# Patient Record
Sex: Female | Born: 1950 | Race: White | Hispanic: No | Marital: Single | Smoking: Current every day smoker
Health system: Southern US, Community
[De-identification: ages and names within clinical notes are randomized; demographics above are authoritative.]

## PROBLEM LIST (undated history)

## (undated) DIAGNOSIS — G4733 Obstructive sleep apnea (adult) (pediatric): Secondary | ICD-10-CM

## (undated) DIAGNOSIS — I5032 Chronic diastolic (congestive) heart failure: Secondary | ICD-10-CM

## (undated) DIAGNOSIS — I509 Heart failure, unspecified: Secondary | ICD-10-CM

## (undated) DIAGNOSIS — I872 Venous insufficiency (chronic) (peripheral): Secondary | ICD-10-CM

## (undated) DIAGNOSIS — G4730 Sleep apnea, unspecified: Secondary | ICD-10-CM

## (undated) DIAGNOSIS — G8929 Other chronic pain: Secondary | ICD-10-CM

## (undated) DIAGNOSIS — F419 Anxiety disorder, unspecified: Secondary | ICD-10-CM

## (undated) DIAGNOSIS — F32A Depression, unspecified: Secondary | ICD-10-CM

## (undated) DIAGNOSIS — R0902 Hypoxemia: Secondary | ICD-10-CM

## (undated) DIAGNOSIS — M1990 Unspecified osteoarthritis, unspecified site: Secondary | ICD-10-CM

## (undated) DIAGNOSIS — J449 Chronic obstructive pulmonary disease, unspecified: Secondary | ICD-10-CM

## (undated) DIAGNOSIS — Z872 Personal history of diseases of the skin and subcutaneous tissue: Secondary | ICD-10-CM

## (undated) DIAGNOSIS — K219 Gastro-esophageal reflux disease without esophagitis: Secondary | ICD-10-CM

## (undated) DIAGNOSIS — F329 Major depressive disorder, single episode, unspecified: Secondary | ICD-10-CM

## (undated) DIAGNOSIS — Z9889 Other specified postprocedural states: Secondary | ICD-10-CM

## (undated) DIAGNOSIS — E1142 Type 2 diabetes mellitus with diabetic polyneuropathy: Secondary | ICD-10-CM

## (undated) DIAGNOSIS — I10 Essential (primary) hypertension: Secondary | ICD-10-CM

## (undated) DIAGNOSIS — I890 Lymphedema, not elsewhere classified: Secondary | ICD-10-CM

## (undated) DIAGNOSIS — T7840XA Allergy, unspecified, initial encounter: Secondary | ICD-10-CM

## (undated) DIAGNOSIS — E039 Hypothyroidism, unspecified: Secondary | ICD-10-CM

## (undated) DIAGNOSIS — E669 Obesity, unspecified: Secondary | ICD-10-CM

## (undated) HISTORY — PX: OTHER SURGICAL HISTORY: SHX169

## (undated) HISTORY — DX: Heart failure, unspecified: I50.9

## (undated) HISTORY — DX: Allergy, unspecified, initial encounter: T78.40XA

## (undated) HISTORY — PX: ABDOMINAL HERNIA REPAIR: SHX539

## (undated) HISTORY — PX: EYE SURGERY: SHX253

## (undated) HISTORY — DX: Unspecified osteoarthritis, unspecified site: M19.90

## (undated) HISTORY — DX: Major depressive disorder, single episode, unspecified: F32.9

## (undated) HISTORY — DX: Sleep apnea, unspecified: G47.30

## (undated) HISTORY — PX: APPENDECTOMY: SHX54

## (undated) HISTORY — DX: Hypoxemia: R09.02

## (undated) HISTORY — PX: WRIST SURGERY: SHX841

## (undated) HISTORY — DX: Depression, unspecified: F32.A

## (undated) HISTORY — PX: TOTAL KNEE ARTHROPLASTY: SHX125

## (undated) HISTORY — PX: ABDOMINAL HYSTERECTOMY: SHX81

## (undated) HISTORY — PX: TONSILLECTOMY: SUR1361

---

## 2006-06-25 ENCOUNTER — Inpatient Hospital Stay (HOSPITAL_COMMUNITY): Admission: EM | Admit: 2006-06-25 | Discharge: 2006-06-26 | Payer: Self-pay | Admitting: Emergency Medicine

## 2007-10-20 ENCOUNTER — Ambulatory Visit: Admission: RE | Admit: 2007-10-20 | Discharge: 2007-10-20 | Payer: Self-pay | Admitting: Nurse Practitioner

## 2008-06-20 IMAGING — US US EXTREM LOW VENOUS BILAT
1 series · 14 of 24 positions shown · non-contrast
Comparison: None.

CLINICAL DATA: Bilateral lower extremity swelling.
BILATERAL LOWER EXTREMITY VENOUS DOPPLER ULTRASOUND:
TECHNIQUE: Gray-scale sonography with compression, as well as color and duplex Doppler ultrasound, were performed to evaluate the deep venous system from the level of the common femoral vein through the popliteal and proximal calf veins.

[Series 1: unknown · 14 of 47 slices shown]
[im 1/47]
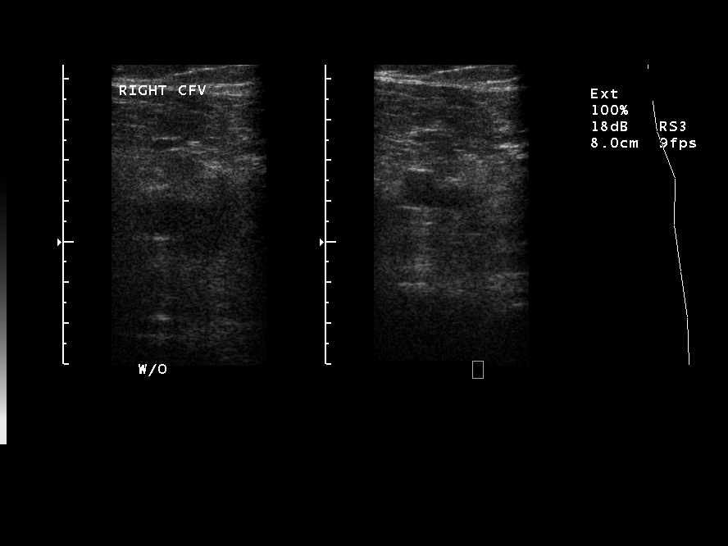
[im 5/47]
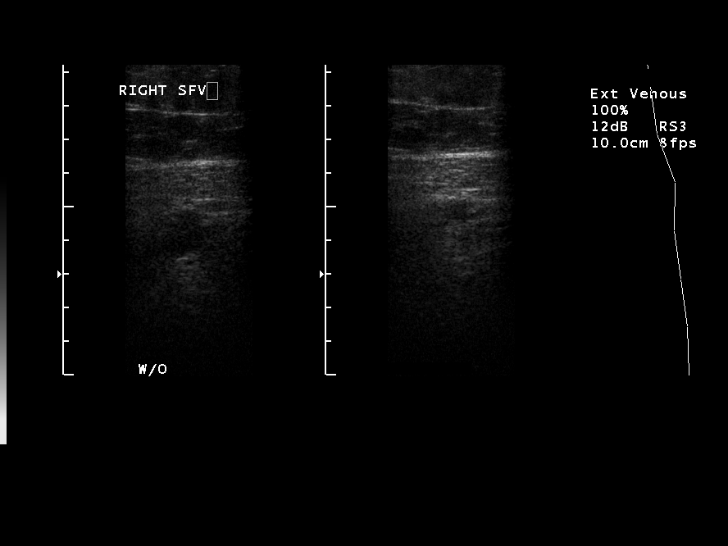
[im 9/47]
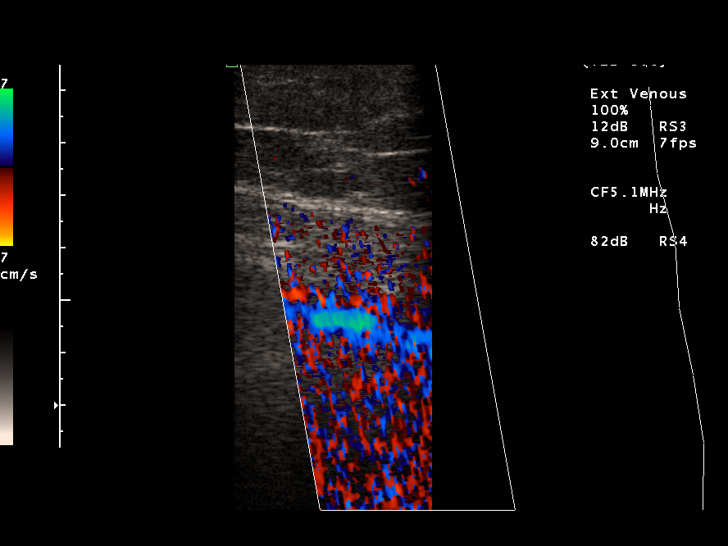
[im 13/47]
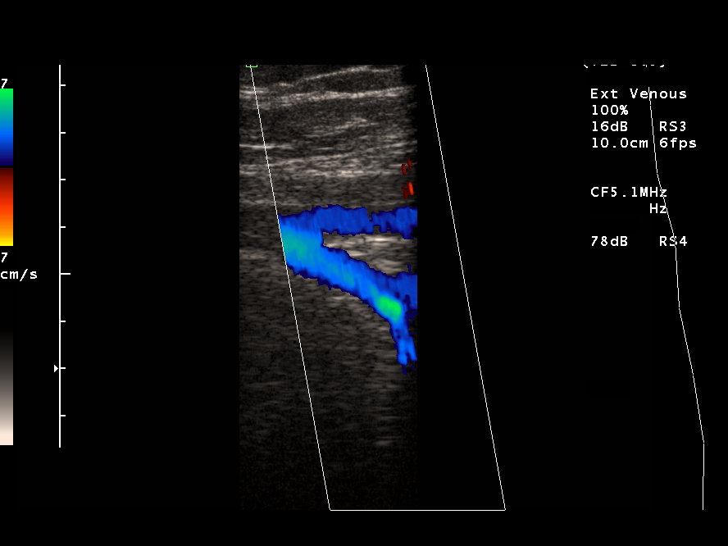
[im 15/47]
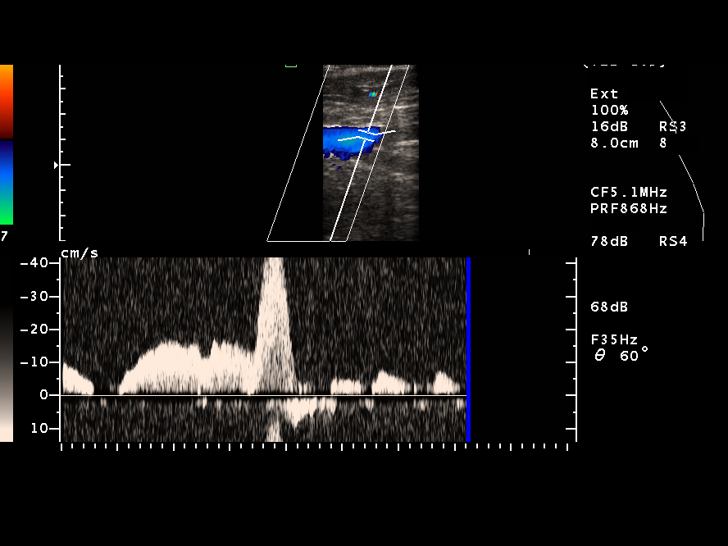
[im 19/47]
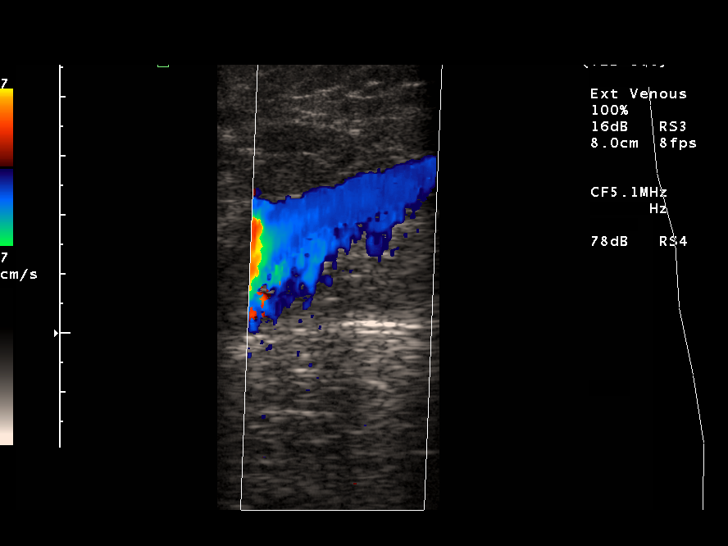
[im 23/47]
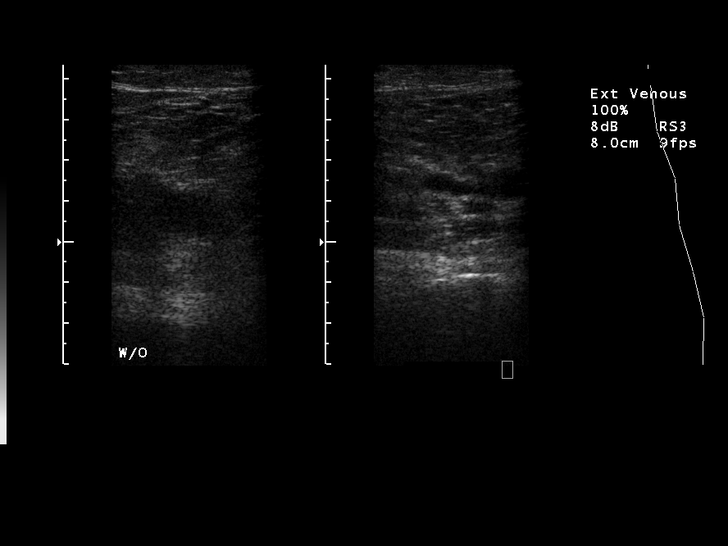
[im 25/47]
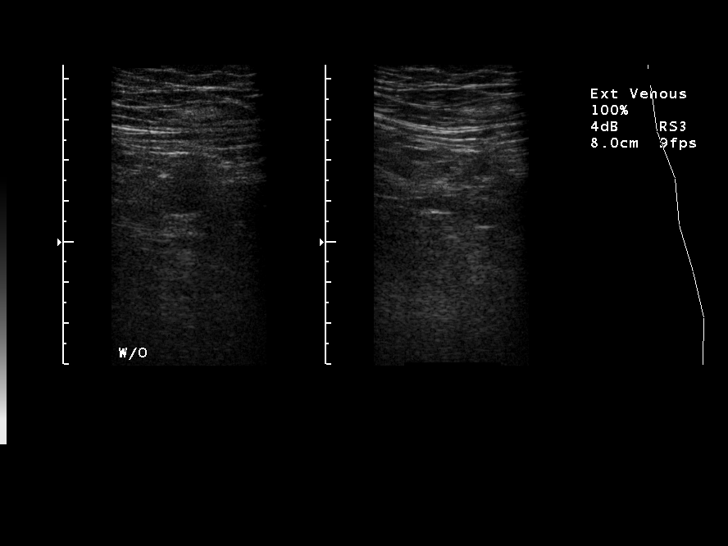
[im 29/47]
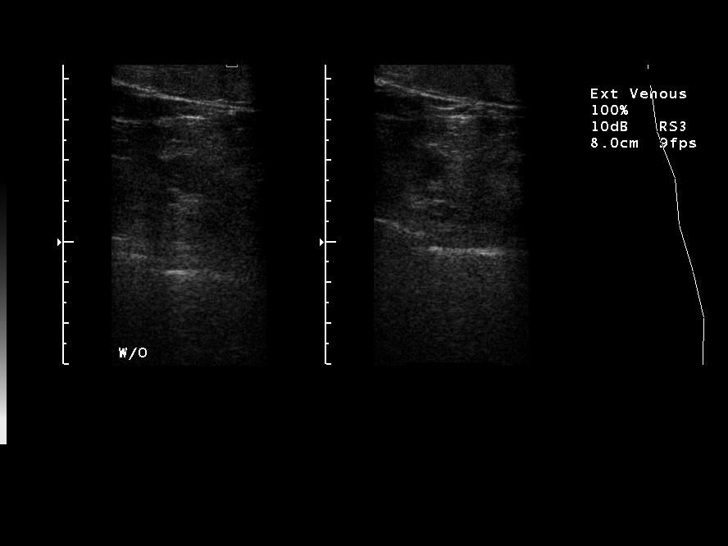
[im 33/47]
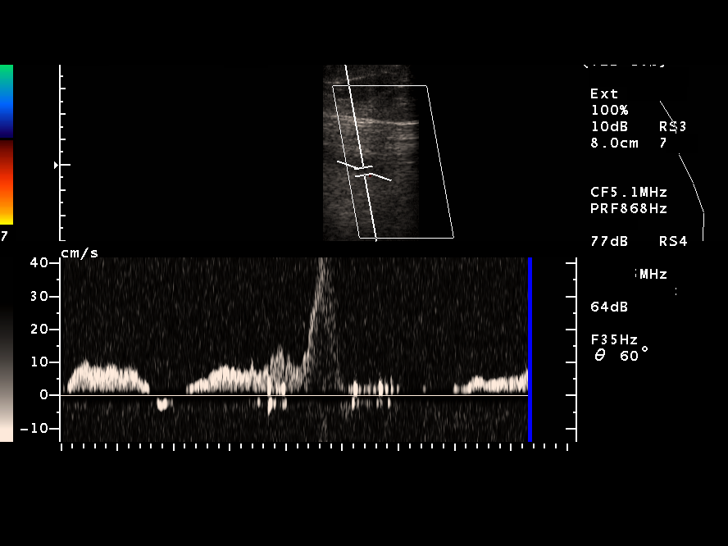
[im 37/47]
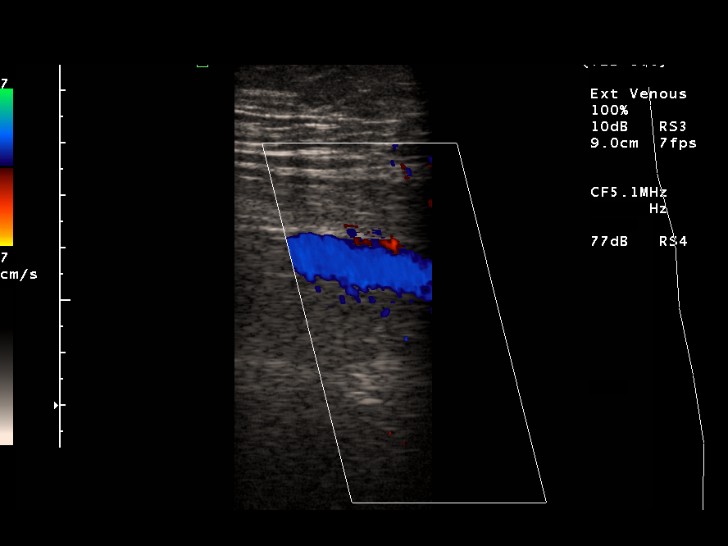
[im 39/47]
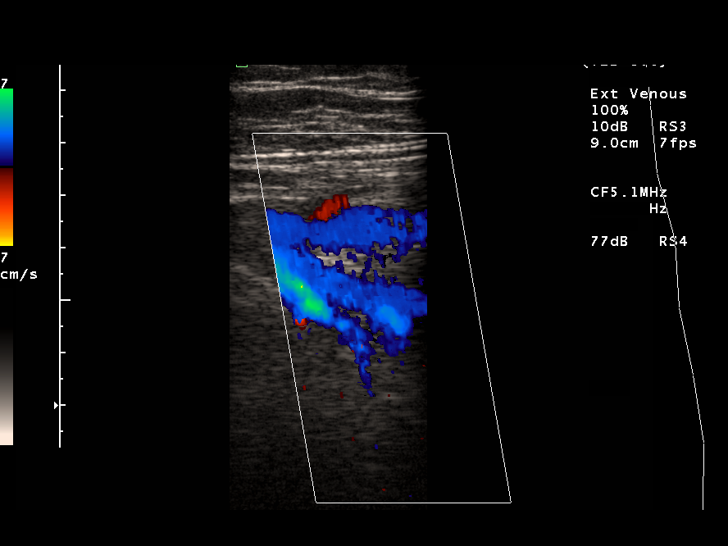
[im 43/47]
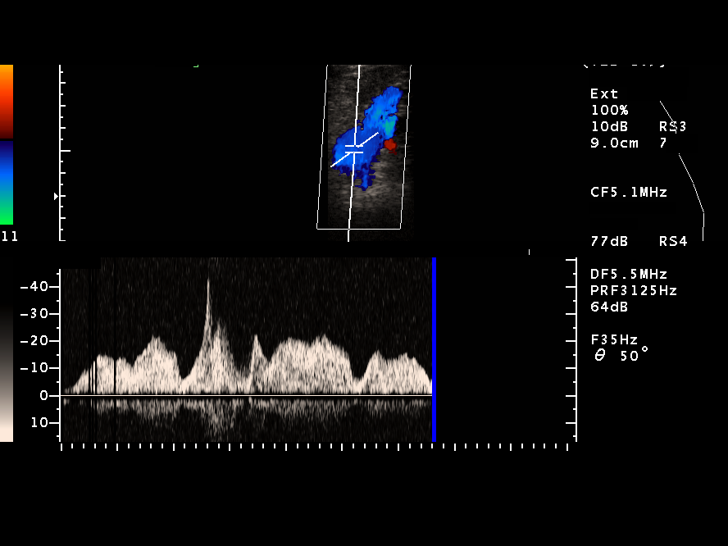
[im 47/47]
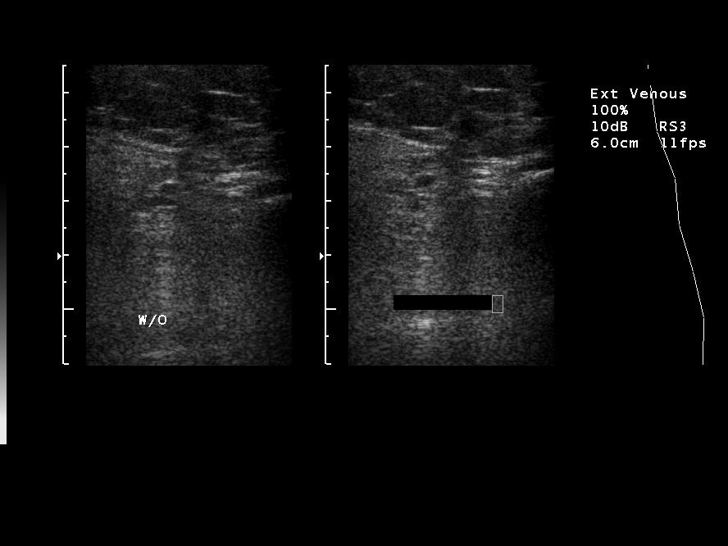

[14 of 24 positions shown; findings below may reference images not displayed]

FINDINGS: Complete compressibility is demonstrated throughout the visualized deep veins.  No venous filling defects are identified by gray-scale or color Doppler sonography.  Doppler waveforms show normal direction of venous flow, with normal phasicity and response to augmentation.
IMPRESSION: Negative.  No evidence of deep venous thrombosis.

## 2008-09-02 DIAGNOSIS — Z9889 Other specified postprocedural states: Secondary | ICD-10-CM

## 2008-09-02 HISTORY — DX: Other specified postprocedural states: Z98.890

## 2010-09-02 HISTORY — PX: SPINAL CORD STIMULATOR IMPLANT: SHX2422

## 2011-01-15 NOTE — Procedures (Signed)
NAMEBREEANNA, Jennifer Mejia           ACCOUNT NO.:  [id number]   MEDICAL RECORD NO.:  [id number]          PATIENT TYPE:  OUT   LOCATION:  SLEE                          FACILITY:  [facility]   PHYSICIAN:  Kofi A. Gerilyn Pilgrim, M.D. DATE OF BIRTH:  04/03/1951   DATE OF PROCEDURE:  10/20/2007  DATE OF DISCHARGE:  10/20/2007                             SLEEP DISORDER REPORT   INDICATION FOR STUDY:  Daytime fatigue, insomnia, and loud snoring.   EPWORTH SLEEPINESS SCALE:  8   MEDICATIONS:  Oxycodone, Ambien, Klonopin.  BMI 54.   SLEEP STUDY SUMMARY:  Total recorded time of 431 minutes.  Sleep  efficiency 86%.  REM latency 191 minutes.  Stage N1 3%, N2 37%, N3 47%,  and REM sleep 12%.   RESPIRATORY SUMMARY:  There was one obstructive event, one mixed event,  and 28 hypopneas with an overall AHI of 4.8.  The study was done on 2  liters of oxygen.  The baseline oxygen saturation was 91%.  The lowest  desaturation was during REM sleep at 81%.   ELECTROCARDIOGRAM SUMMARY:  Average heart rate 74 with no significant  dysrhythmias noted.   LIMB MOVEMENT INDEX:  The PLM index is 9.2.   IMPRESSION:  1. Hypoventilation syndrome.  2. 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   RECOMMENDATIONS:  Consider increasing nocturnal oxygen to 2-3 liters.  Thanks for this referral.      Kofi A. Gerilyn Pilgrim, M.D.  Electronically Signed     KAD/MEDQ  D:  10/26/2007  T:  10/26/2007  Job:  [id number]

## 2011-01-18 NOTE — H&P (Signed)
Jennifer Mejia, Jennifer Mejia           ACCOUNT NO.:  [id number]   MEDICAL RECORD NO.:  [id number]          PATIENT TYPE:  OBV   LOCATION:  IC06                          FACILITY:  [facility]   PHYSICIAN:  Osvaldo Shipper, MD     DATE OF BIRTH:  [birth year]   DATE OF ADMISSION:  06/23/2006  DATE OF DISCHARGE:  LH                                HISTORY & PHYSICAL   PRIMARY CARE PHYSICIAN:  Concha Pyo, NP.   ORTHOPEDIC SURGEON:  Dr. Manson Passey at [facility].   She also has a pain specialist, Dr. Brigid Re, and she also has a  Midwife.   ADMITTING DIAGNOSIS:  1. Acute chronic obstructive pulmonary disease exacerbation.  2. Chronic back pain.  3. Morbid obesity.  4. Type-2 diabetes.  5. Hypertension.   CHIEF COMPLAINT:  Shortness of breath for 2 days.   HISTORY OF PRESENT ILLNESS:  The patient is a 60 year old, morbidly obese,  Caucasian female who was doing well until about 2 days ago when she started  feeling short of breath, started feeling some wheezing.  This progressively  got worse.  Today she went to her PMDs office where she was given another  breathing treatment with no effect.  The patient has been taking breathing  treatments at home as well with no effect.  She also is complaining of some  cough with  yellowish expectoration, no blood.  No history of fevers or  chills at home.  No sick contacts, no travel outside this area recently.  She said yesterday her symptoms were quite bad.  Today after a breathing  treatment here in the ED she feels a little bit better.  She also complains  of chest pain described as a tightness for the past 2 days ever since her  breathing difficulties started.  This chest tightness is located in the  retrosternal area and is pretty much on and off.  Any activity does not make  any difference in the pain.   MEDICATIONS AT HOME:  1. Avandia 8 mg p.o. daily.  2. Nasonex 2 sprays daily.  3. Metformin 1000 mg b.i.d.  4. Atenolol 50 mg daily.  5. K-Dur 20 mEq daily.  6. Lasix 80 mg q.a.m.  7. Lipitor 40 mg daily.  8. Combivent inhaler.  9. Symbicort inhaler.  10.Protonix 40 mg daily.  11.Ambien 10 mg nightly as needed.  12.Byetta 10 units subcutaneous b.i.d.  13.Klonopin 2 mg nightly.  14.Lyrica 75 mg t.i.d.  15.Soma 350 mg t.i.d.  16.OxyContin 80 mg in the morning, 60 in the afternoon and 60 at bedtime.  17.She also takes Chantix 1 mg twice daily for smoking cessation.  18.She also is on a nicotine patch.   ALLERGIES:  SHE IS ALLERGIC TO AVELOX WHICH CAUSED SEVERE RASH ON HER BODY.   PAST MEDICAL HISTORY:  Significant for bronchitis on and off for the past 8  years.  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  History of chronic back pain for which she is going to have back  surgery  soon.  History of multiple surgeries including 9 to her left leg for chronic  venous stasis and ulcers.  Hysterectomy.  She had surgery for acid reflux,  possibly fundoplication.  History of hernia repair.  History of fracture of  the right foot with plates inserted.  History of cardiac catheterization in  2004 which was unremarkable.  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  Her records from the PMDs office show a colonoscopy which was  normal in January 2006.  Mammogram repeated in September 2007 was normal;  recheck in one year was recommended.   SOCIAL HISTORY:  Lives in [place].  Lives alone.  Smokes one pack of  cigarettes per day, but she says she quit yesterday.  No alcohol use.  She  uses a cane and walker at various points in time.  She also has a  prescription for a motorized wheelchair for her severe back pain.   FAMILY HISTORY:  Mother died; she had extensive heart disease, diabetes,  liver disease.  Father is alive and well.  Sister has a pacemaker.  No other  medical problems in the family.   REVIEW  OF SYSTEMS:  Ten-point review of systems was done which was  unremarkable except for chronic back pains and lower extremity pain from  arthritis.   PHYSICAL EXAMINATION:  VITAL SIGNS:  Temperature 97.6, blood pressure  132/59, heart rate in the 50s, respiratory rate about 24.  Saturation 92% on  room air.  GENERAL EXAM:  This is a morbidly obese white female in mild discomfort but  in no distress.  HEENT:  There is no pallor.  Mucous membranes moist.  No oral lesions are  noted.  LUNGS:  Reveal diffuse extensive bilateral end-expiratory wheezing.  No  crackles appreciated.  CARDIOVASCULAR:  S1 S2 is normal, regular, no murmurs appreciated.  ABDOMEN:  Obese, nontender, nondistended, bowel sounds are present.  No mass  or organomegaly is appreciated.  EXTREMITIES:  Mild edema and surgical scars on the left side.  There is some  erythema of the lower part of both legs, more on the left side than the  right.  There is also some tenderness to palpation in her calves.  Pulses  are present in both lower extremities.  NEUROLOGIC:  The patient is alert and oriented x3.  No focal neurological  deficits are present.   LABORATORY DATA:  Her ABG showed a pH of 7.34, pCO2 48, pO2 64, bicarb 25,  saturation 91%.  White count is 9.1 with normal differential, hemoglobin is  11.9, MCV 81, platelet count is 280.  Her BMP reveals a glucose of 187,  creatinine 0.8, calcium 8.0.   X-rays showed bronchitic changes but no acute infiltrate.  No EKG is  available.   IMPRESSION:  This is a 60 year old Caucasian female with medical problems as  stated earlier who presents with worsening shortness of breath over the past  2 days.  Symptoms and presentation consistent with acute bronchitis or  chronic obstructive pulmonary disease exacerbation.  Her other co-  morbidities appear to be stable at this time.   PLAN: 1. Acute COPD.  Will give her nebulizer treatments, give her steroids,      give her  antibiotics.  Will not give her any fluoroquinolones as she      has a documented allergy to Avelox.  She will need PFT at some point.      Oxygen will be provided  if needed.  2. Lower extremity edema . Will get venous Dopplers to rule out DVT, but      this does appear to be chronic.  3. Diabetes.  Continue her oral hypoglycemic agents.  Put her also on      sliding scale insulin and check CBG q.a.c. and h.s.  Check hemoglobin      A1c in the a.m.  4. Anemia.  Check her iron profile  5. Hypertension.  Continue atenolol.  6. Chronic back pain.  Continue all of her pain medications as prescribed      by her pain physician.  7. DVT and GI prophylaxis will be initiated.  8. Continue Chantix and nicotine patch.   Anticipate the patient going home in the next couple of days.   Further management decisions will be based on results of initial testing and  the patient's response to treatment.      Osvaldo Shipper, MD  Electronically Signed     GK/MEDQ  D:  06/23/2006  T:  06/23/2006  Job:  [id number]   cc:   Concha Pyo, NP  [address] [place], [place] [address]

## 2011-01-18 NOTE — Discharge Summary (Signed)
Jennifer Mejia, Jennifer Mejia           ACCOUNT NO.:  [id number]   MEDICAL RECORD NO.:  [id number]          PATIENT TYPE:  INP   LOCATION:  A322                          FACILITY:  [facility]   PHYSICIAN:  Osvaldo Shipper, MD     DATE OF BIRTH:  1951-07-25   DATE OF ADMISSION:  06/23/2006  DATE OF DISCHARGE:  10/25/2007LH                                 DISCHARGE SUMMARY   PRIMARY CARE PHYSICIAN:  Junius Creamer, Nurse Practitioner at [facility].   Please see H and P dictated by myself for details regarding patient's  presenting illness.   DISCHARGE DIAGNOSIS:  1. Acute chronic obstructive pulmonary disease exacerbation, improved.  2. Chronic back pain.  3. Morbid obesity.  4. Type 2 diabetes, poorly controlled.  5. Hypertension, stable.  6. Hypoxia, likely related to chronic obstructive pulmonary disease,      requiring home O2.  7. Possible sleep apnea, requiring sleep study.   BRIEF HOSPITAL COURSE:  1. COPD.  Patient is a 60 year old morbidly obese Caucasian female who      presented with worsening shortness of breath over 2 to 3 days prior to      admission.  Patient was initially evaluated by the emergency      department.  Her ABG showed a pH of 7.34, pCO2 was 48, pO2 was 64,      saturation 91%; this was on room air.  Her initial blood counts were      all pretty unremarkable except for elevated glucose.  Her TSH was      normal.  She ruled out for acute coronary syndrome.  She underwent a      chest x-ray as well which did not show any clear-cut infiltrate.      Bronchitic changes were noted.  Patient was treated for acute COPD,      given nebulizer treatment, given steroids, antibiotics.  Patient slowly      improved with this treatment strategy.  Today, patient was feeling      quite well.  She was ambulating in the hallways with no difficulties.      She was, however, found to be hypoxic with a saturation of 83% on room      air.  Hence, she was told she would need home  O2.  2. Diabetes became poorly controlled because of the steroids that were      started.  However, she was given one dose of Lantus.  She was also      started on Byetta that she takes at home.  Her blood sugars today are      much improved.   Patient also mentioned to [place] that she snores in the middle of the night.  Because of her morbid obesity, there was also a concern about sleep apnea.  We will set her up for an outpatient sleep study.  Patient would also  benefit from a pulmonary function test when her lungs are back to her  baseline.  She may benefit from Advair, which we will defer to her PMD after  the PFTs are done.   Her other medical  issues including obesity, chronic back pains,  hypertension, all remained stable during this hospitalization.   She was also found to have during this hospital stay, possible iron  deficiency anemia.  Her TIBC was very high, iron was low, percent sat was  low, ferritin was low.  She was started on Nu-Iron.  According to the report  from her PMD's office, she has had a colonoscopy within the last couple of  years which was unremarkable.  Hence, I think patient may benefit from being  referred to a hematologist.  I will defer this issue again to the PMD for  now.   Please also note that the patient has a borderline low B12 level for which  she was given one shot 1000 mg vitamin B12.   On the day of discharge, patient was feeling really well.  She wanted to go  home, and we do not see any reason why not.   DISCHARGE MEDICATIONS:  1. Albuterol 2.5 mg nebulizers every 6 hours.  2. Atrovent 0.5 mg nebulizer every 6 hours.  3. Prednisone 40 mg for 4 days, 20 mg for 4 days, and then 10 mg for 4      days.  4. Vantin 100 mg b.i.d. for 4 days.  5. Nu-Iron 150 mg p.o. daily.   Oxygen by Nasal Canula at 2lpm.   Otherwise, she was asked to continue her other outpatient medications as  before.  Please see H and P for a complete list.   DIET:  She  was asked to follow a low modified carbohydrate diet.   PHYSICAL ACTIVITY:  She uses a cane to ambulate, which she was recommended  to do so.  She was also seen by PT and was thought not to need any skill PT  at this time.   FOLLOWUP:  1. Follow up with her PMD in 1 to 2 weeks.  2. For a pulmonary function test in 6 to 8 weeks.  3. Outpatient sleep study in 4 to 6 weeks.   No consultations were obtained.   Total time discharging this patient, about 40 minutes.      Osvaldo Shipper, MD  Electronically Signed     GK/MEDQ  D:  06/26/2006  T:  06/26/2006  Job:  [id number]   cc:   Ninfa Linden, NP

## 2014-11-09 ENCOUNTER — Other Ambulatory Visit (HOSPITAL_COMMUNITY): Payer: Self-pay

## 2014-11-09 ENCOUNTER — Encounter (HOSPITAL_COMMUNITY): Payer: Self-pay

## 2014-11-09 ENCOUNTER — Inpatient Hospital Stay (HOSPITAL_COMMUNITY)
Admission: EM | Admit: 2014-11-09 | Discharge: 2014-11-14 | DRG: 291 | Disposition: A | Discharge status: Home Health | Payer: Medicare Other | Attending: Internal Medicine | Admitting: Internal Medicine

## 2014-11-09 ENCOUNTER — Emergency Department (HOSPITAL_COMMUNITY): Payer: Medicare Other

## 2014-11-09 DIAGNOSIS — L03115 Cellulitis of right lower limb: Secondary | ICD-10-CM | POA: Diagnosis not present

## 2014-11-09 DIAGNOSIS — I5033 Acute on chronic diastolic (congestive) heart failure: Secondary | ICD-10-CM | POA: Insufficient documentation

## 2014-11-09 DIAGNOSIS — I248 Other forms of acute ischemic heart disease: Secondary | ICD-10-CM | POA: Diagnosis present

## 2014-11-09 DIAGNOSIS — Z96653 Presence of artificial knee joint, bilateral: Secondary | ICD-10-CM | POA: Diagnosis present

## 2014-11-09 DIAGNOSIS — K219 Gastro-esophageal reflux disease without esophagitis: Secondary | ICD-10-CM | POA: Diagnosis present

## 2014-11-09 DIAGNOSIS — L03119 Cellulitis of unspecified part of limb: Secondary | ICD-10-CM | POA: Diagnosis not present

## 2014-11-09 DIAGNOSIS — I890 Lymphedema, not elsewhere classified: Secondary | ICD-10-CM | POA: Diagnosis not present

## 2014-11-09 DIAGNOSIS — Z9119 Patient's noncompliance with other medical treatment and regimen: Secondary | ICD-10-CM | POA: Diagnosis present

## 2014-11-09 DIAGNOSIS — Z7951 Long term (current) use of inhaled steroids: Secondary | ICD-10-CM | POA: Diagnosis not present

## 2014-11-09 DIAGNOSIS — L0390 Cellulitis, unspecified: Secondary | ICD-10-CM

## 2014-11-09 DIAGNOSIS — Z79891 Long term (current) use of opiate analgesic: Secondary | ICD-10-CM | POA: Diagnosis not present

## 2014-11-09 DIAGNOSIS — E1165 Type 2 diabetes mellitus with hyperglycemia: Secondary | ICD-10-CM | POA: Diagnosis present

## 2014-11-09 DIAGNOSIS — I10 Essential (primary) hypertension: Secondary | ICD-10-CM | POA: Diagnosis present

## 2014-11-09 DIAGNOSIS — Z833 Family history of diabetes mellitus: Secondary | ICD-10-CM

## 2014-11-09 DIAGNOSIS — Z8249 Family history of ischemic heart disease and other diseases of the circulatory system: Secondary | ICD-10-CM | POA: Diagnosis not present

## 2014-11-09 DIAGNOSIS — I509 Heart failure, unspecified: Secondary | ICD-10-CM | POA: Diagnosis not present

## 2014-11-09 DIAGNOSIS — E1142 Type 2 diabetes mellitus with diabetic polyneuropathy: Secondary | ICD-10-CM | POA: Diagnosis present

## 2014-11-09 DIAGNOSIS — E039 Hypothyroidism, unspecified: Secondary | ICD-10-CM | POA: Diagnosis present

## 2014-11-09 DIAGNOSIS — Z6841 Body Mass Index (BMI) 40.0 and over, adult: Secondary | ICD-10-CM | POA: Diagnosis not present

## 2014-11-09 DIAGNOSIS — G8929 Other chronic pain: Secondary | ICD-10-CM | POA: Diagnosis present

## 2014-11-09 DIAGNOSIS — Z9981 Dependence on supplemental oxygen: Secondary | ICD-10-CM

## 2014-11-09 DIAGNOSIS — R0602 Shortness of breath: Secondary | ICD-10-CM

## 2014-11-09 DIAGNOSIS — F17210 Nicotine dependence, cigarettes, uncomplicated: Secondary | ICD-10-CM | POA: Diagnosis present

## 2014-11-09 DIAGNOSIS — M549 Dorsalgia, unspecified: Secondary | ICD-10-CM | POA: Diagnosis present

## 2014-11-09 DIAGNOSIS — J9621 Acute and chronic respiratory failure with hypoxia: Secondary | ICD-10-CM | POA: Diagnosis present

## 2014-11-09 DIAGNOSIS — J441 Chronic obstructive pulmonary disease with (acute) exacerbation: Secondary | ICD-10-CM

## 2014-11-09 DIAGNOSIS — E119 Type 2 diabetes mellitus without complications: Secondary | ICD-10-CM | POA: Diagnosis not present

## 2014-11-09 DIAGNOSIS — Z79899 Other long term (current) drug therapy: Secondary | ICD-10-CM

## 2014-11-09 DIAGNOSIS — E662 Morbid (severe) obesity with alveolar hypoventilation: Secondary | ICD-10-CM | POA: Diagnosis present

## 2014-11-09 DIAGNOSIS — Z794 Long term (current) use of insulin: Secondary | ICD-10-CM

## 2014-11-09 HISTORY — DX: Anxiety disorder, unspecified: F41.9

## 2014-11-09 HISTORY — DX: Gastro-esophageal reflux disease without esophagitis: K21.9

## 2014-11-09 HISTORY — DX: Lymphedema, not elsewhere classified: I89.0

## 2014-11-09 HISTORY — DX: Chronic obstructive pulmonary disease, unspecified: J44.9

## 2014-11-09 HISTORY — DX: Essential (primary) hypertension: I10

## 2014-11-09 HISTORY — DX: Obesity, unspecified: E66.9

## 2014-11-09 HISTORY — DX: Other specified postprocedural states: Z98.890

## 2014-11-09 HISTORY — DX: Type 2 diabetes mellitus with diabetic polyneuropathy: E11.42

## 2014-11-09 HISTORY — DX: Obstructive sleep apnea (adult) (pediatric): G47.33

## 2014-11-09 HISTORY — DX: Personal history of diseases of the skin and subcutaneous tissue: Z87.2

## 2014-11-09 HISTORY — DX: Hypothyroidism, unspecified: E03.9

## 2014-11-09 HISTORY — DX: Venous insufficiency (chronic) (peripheral): I87.2

## 2014-11-09 HISTORY — DX: Other chronic pain: G89.29

## 2014-11-09 HISTORY — DX: Chronic diastolic (congestive) heart failure: I50.32

## 2014-11-09 LAB — BASIC METABOLIC PANEL
Anion gap: 7 (ref 5–15)
BUN: 16 mg/dL (ref 6–23)
CO2: 35 mmol/L — AB (ref 19–32)
Calcium: 8.2 mg/dL — ABNORMAL LOW (ref 8.4–10.5)
Chloride: 93 mmol/L — ABNORMAL LOW (ref 96–112)
Creatinine, Ser: 0.95 mg/dL (ref 0.50–1.10)
GFR, EST AFRICAN AMERICAN: 73 mL/min — AB (ref >90)
GFR, EST NON AFRICAN AMERICAN: 63 mL/min — AB (ref >90)
Glucose, Bld: 221 mg/dL — ABNORMAL HIGH (ref 70–99)
POTASSIUM: 3.4 mmol/L — AB (ref 3.5–5.1)
Sodium: 135 mmol/L (ref 135–145)

## 2014-11-09 LAB — CBC WITH DIFFERENTIAL/PLATELET
BASOS ABS: 0 10*3/uL (ref 0.0–0.1)
Basophils Relative: 0 % (ref 0–1)
EOS ABS: 0.1 10*3/uL (ref 0.0–0.7)
Eosinophils Relative: 1 % (ref 0–5)
HEMATOCRIT: 42.1 % (ref 36.0–46.0)
HEMOGLOBIN: 13 g/dL (ref 12.0–15.0)
LYMPHS ABS: 0.8 10*3/uL (ref 0.7–4.0)
LYMPHS PCT: 8 % — AB (ref 12–46)
MCH: 26.4 pg (ref 26.0–34.0)
MCHC: 30.9 g/dL (ref 30.0–36.0)
MCV: 85.4 fL (ref 78.0–100.0)
MONO ABS: 0.7 10*3/uL (ref 0.1–1.0)
Monocytes Relative: 8 % (ref 3–12)
NEUTROS ABS: 8.1 10*3/uL — AB (ref 1.7–7.7)
Neutrophils Relative %: 83 % — ABNORMAL HIGH (ref 43–77)
PLATELETS: 237 10*3/uL (ref 150–400)
RBC: 4.93 MIL/uL (ref 3.87–5.11)
RDW: 17 % — ABNORMAL HIGH (ref 11.5–15.5)
WBC: 9.8 10*3/uL (ref 4.0–10.5)

## 2014-11-09 LAB — URINALYSIS, ROUTINE W REFLEX MICROSCOPIC
BILIRUBIN URINE: NEGATIVE
GLUCOSE, UA: NEGATIVE mg/dL
HGB URINE DIPSTICK: NEGATIVE
KETONES UR: NEGATIVE mg/dL
LEUKOCYTES UA: NEGATIVE
NITRITE: NEGATIVE
PH: 6 (ref 5.0–8.0)
PROTEIN: NEGATIVE mg/dL
UROBILINOGEN UA: 0.2 mg/dL (ref 0.0–1.0)

## 2014-11-09 LAB — BRAIN NATRIURETIC PEPTIDE: B NATRIURETIC PEPTIDE 5: 105 pg/mL — AB (ref 0.0–100.0)

## 2014-11-09 LAB — TROPONIN I: TROPONIN I: 0.27 ng/mL — AB (ref <0.031)

## 2014-11-09 MED ORDER — ATORVASTATIN CALCIUM 40 MG PO TABS
80.0000 mg | ORAL_TABLET | Freq: Every day | ORAL | Status: DC
  Administered 2014-11-10 – 2014-11-13 (×5): 80 mg via ORAL
  Filled 2014-11-09 (×5): qty 2

## 2014-11-09 MED ORDER — METHYLPREDNISOLONE SODIUM SUCC 125 MG IJ SOLR
60.0000 mg | Freq: Four times a day (QID) | INTRAMUSCULAR | Status: DC
  Administered 2014-11-10 (×2): 60 mg via INTRAVENOUS
  Filled 2014-11-09 (×2): qty 2

## 2014-11-09 MED ORDER — GABAPENTIN 400 MG PO CAPS
800.0000 mg | ORAL_CAPSULE | ORAL | Status: DC
  Administered 2014-11-10 – 2014-11-14 (×18): 800 mg via ORAL
  Filled 2014-11-09 (×9): qty 2
  Filled 2014-11-09: qty 8
  Filled 2014-11-09 (×12): qty 2

## 2014-11-09 MED ORDER — SPIRONOLACTONE 25 MG PO TABS
50.0000 mg | ORAL_TABLET | Freq: Every day | ORAL | Status: DC
  Administered 2014-11-10 – 2014-11-14 (×5): 50 mg via ORAL
  Filled 2014-11-09 (×5): qty 2

## 2014-11-09 MED ORDER — SODIUM CHLORIDE 0.9 % IV SOLN
250.0000 mL | INTRAVENOUS | Status: DC | PRN
  Administered 2014-11-10: 10 mL via INTRAVENOUS

## 2014-11-09 MED ORDER — ONDANSETRON HCL 4 MG PO TABS
4.0000 mg | ORAL_TABLET | Freq: Four times a day (QID) | ORAL | Status: DC | PRN
  Administered 2014-11-11: 4 mg via ORAL
  Filled 2014-11-09: qty 1

## 2014-11-09 MED ORDER — PRIMIDONE 50 MG PO TABS
50.0000 mg | ORAL_TABLET | Freq: Two times a day (BID) | ORAL | Status: DC
  Administered 2014-11-10 – 2014-11-14 (×10): 50 mg via ORAL
  Filled 2014-11-09 (×14): qty 1

## 2014-11-09 MED ORDER — ONDANSETRON HCL 4 MG/2ML IJ SOLN: 4.0000 mg | Freq: Four times a day (QID) | INTRAMUSCULAR | Status: DC | PRN

## 2014-11-09 MED ORDER — PREDNISOLONE ACETATE 1 % OP SUSP
1.0000 [drp] | Freq: Four times a day (QID) | OPHTHALMIC | Status: DC
  Administered 2014-11-10: 1 [drp] via OPHTHALMIC
  Filled 2014-11-09: qty 1

## 2014-11-09 MED ORDER — OXYCODONE HCL ER 20 MG PO T12A
60.0000 mg | EXTENDED_RELEASE_TABLET | Freq: Three times a day (TID) | ORAL | Status: DC
  Administered 2014-11-10 – 2014-11-14 (×13): 60 mg via ORAL
  Filled 2014-11-09: qty 3
  Filled 2014-11-09: qty 6
  Filled 2014-11-09 (×2): qty 3
  Filled 2014-11-09 (×2): qty 6
  Filled 2014-11-09 (×7): qty 3

## 2014-11-09 MED ORDER — PANTOPRAZOLE SODIUM 40 MG PO TBEC
40.0000 mg | DELAYED_RELEASE_TABLET | Freq: Two times a day (BID) | ORAL | Status: DC
  Administered 2014-11-10 – 2014-11-14 (×10): 40 mg via ORAL
  Filled 2014-11-09 (×10): qty 1

## 2014-11-09 MED ORDER — OXYCODONE HCL 5 MG PO TABS: 15.0000 mg | ORAL_TABLET | Freq: Four times a day (QID) | ORAL | Status: DC

## 2014-11-09 MED ORDER — IPRATROPIUM-ALBUTEROL 0.5-2.5 (3) MG/3ML IN SOLN
3.0000 mL | Freq: Four times a day (QID) | RESPIRATORY_TRACT | Status: DC
  Administered 2014-11-10 – 2014-11-14 (×16): 3 mL via RESPIRATORY_TRACT
  Filled 2014-11-09 (×16): qty 3

## 2014-11-09 MED ORDER — ENOXAPARIN SODIUM 40 MG/0.4ML ~~LOC~~ SOLN
40.0000 mg | SUBCUTANEOUS | Status: DC
  Administered 2014-11-10: 40 mg via SUBCUTANEOUS
  Filled 2014-11-09: qty 0.4

## 2014-11-09 MED ORDER — RAMIPRIL 5 MG PO CAPS
10.0000 mg | ORAL_CAPSULE | Freq: Every day | ORAL | Status: DC
  Administered 2014-11-10 – 2014-11-14 (×5): 10 mg via ORAL
  Filled 2014-11-09 (×5): qty 2

## 2014-11-09 MED ORDER — BUDESONIDE-FORMOTEROL FUMARATE 160-4.5 MCG/ACT IN AERO
2.0000 | INHALATION_SPRAY | Freq: Two times a day (BID) | RESPIRATORY_TRACT | Status: DC
  Administered 2014-11-10 – 2014-11-14 (×8): 2 via RESPIRATORY_TRACT
  Filled 2014-11-09: qty 6

## 2014-11-09 MED ORDER — IPRATROPIUM-ALBUTEROL 0.5-2.5 (3) MG/3ML IN SOLN
3.0000 mL | RESPIRATORY_TRACT | Status: DC
  Administered 2014-11-09 (×2): 3 mL via RESPIRATORY_TRACT
  Filled 2014-11-09 (×2): qty 3

## 2014-11-09 MED ORDER — LORATADINE 10 MG PO TABS
10.0000 mg | ORAL_TABLET | Freq: Every day | ORAL | Status: DC
  Administered 2014-11-10 – 2014-11-13 (×4): 10 mg via ORAL
  Filled 2014-11-09 (×4): qty 1

## 2014-11-09 MED ORDER — LEVOTHYROXINE SODIUM 50 MCG PO TABS
50.0000 ug | ORAL_TABLET | Freq: Every day | ORAL | Status: DC
  Administered 2014-11-10 – 2014-11-14 (×5): 50 ug via ORAL
  Filled 2014-11-09 (×2): qty 2
  Filled 2014-11-09: qty 1
  Filled 2014-11-09: qty 2
  Filled 2014-11-09: qty 1

## 2014-11-09 MED ORDER — ALBUTEROL SULFATE (2.5 MG/3ML) 0.083% IN NEBU: 2.5000 mg | INHALATION_SOLUTION | Freq: Four times a day (QID) | RESPIRATORY_TRACT | Status: DC

## 2014-11-09 MED ORDER — LEVOCETIRIZINE DIHYDROCHLORIDE 5 MG PO TABS: 5.0000 mg | ORAL_TABLET | Freq: Every evening | ORAL | Status: DC

## 2014-11-09 MED ORDER — CLONAZEPAM 0.5 MG PO TABS
2.0000 mg | ORAL_TABLET | Freq: Every day | ORAL | Status: DC
  Administered 2014-11-10 – 2014-11-13 (×5): 2 mg via ORAL
  Filled 2014-11-09 (×5): qty 4

## 2014-11-09 MED ORDER — GABAPENTIN 400 MG PO CAPS
1600.0000 mg | ORAL_CAPSULE | Freq: Every day | ORAL | Status: DC
  Administered 2014-11-10 – 2014-11-13 (×5): 1600 mg via ORAL
  Filled 2014-11-09 (×7): qty 4

## 2014-11-09 MED ORDER — LEVOTHYROXINE SODIUM 25 MCG PO TABS: 25.0000 ug | ORAL_TABLET | Freq: Every day | ORAL | Status: DC

## 2014-11-09 MED ORDER — DOCUSATE SODIUM 100 MG PO CAPS
100.0000 mg | ORAL_CAPSULE | Freq: Two times a day (BID) | ORAL | Status: DC
  Administered 2014-11-10 – 2014-11-14 (×10): 100 mg via ORAL
  Filled 2014-11-09 (×10): qty 1

## 2014-11-09 MED ORDER — OXYCODONE HCL 5 MG PO TABS
15.0000 mg | ORAL_TABLET | Freq: Four times a day (QID) | ORAL | Status: DC | PRN
  Administered 2014-11-10 – 2014-11-13 (×2): 15 mg via ORAL
  Filled 2014-11-09 (×2): qty 3

## 2014-11-09 MED ORDER — BUDESONIDE-FORMOTEROL FUMARATE 160-4.5 MCG/ACT IN AERO
INHALATION_SPRAY | RESPIRATORY_TRACT | Status: AC
  Filled 2014-11-09: qty 6

## 2014-11-09 MED ORDER — ZOLPIDEM TARTRATE 5 MG PO TABS
5.0000 mg | ORAL_TABLET | Freq: Every evening | ORAL | Status: DC | PRN
  Administered 2014-11-10 – 2014-11-13 (×4): 5 mg via ORAL
  Filled 2014-11-09 (×5): qty 1

## 2014-11-09 MED ORDER — IPRATROPIUM BROMIDE 0.02 % IN SOLN: 0.5000 mg | Freq: Four times a day (QID) | RESPIRATORY_TRACT | Status: DC

## 2014-11-09 MED ORDER — DILTIAZEM HCL ER COATED BEADS 120 MG PO CP24
120.0000 mg | ORAL_CAPSULE | Freq: Every day | ORAL | Status: DC
  Administered 2014-11-10 – 2014-11-14 (×5): 120 mg via ORAL
  Filled 2014-11-09 (×5): qty 1

## 2014-11-09 MED ORDER — DULOXETINE HCL 60 MG PO CPEP
60.0000 mg | ORAL_CAPSULE | Freq: Every day | ORAL | Status: DC
  Administered 2014-11-10 – 2014-11-14 (×5): 60 mg via ORAL
  Filled 2014-11-09 (×5): qty 1

## 2014-11-09 MED ORDER — FUROSEMIDE 10 MG/ML IJ SOLN
40.0000 mg | Freq: Once | INTRAMUSCULAR | Status: AC
  Administered 2014-11-09: 40 mg via INTRAVENOUS
  Filled 2014-11-09: qty 4

## 2014-11-09 MED ORDER — FUROSEMIDE 10 MG/ML IJ SOLN
40.0000 mg | Freq: Two times a day (BID) | INTRAMUSCULAR | Status: DC
  Administered 2014-11-10: 40 mg via INTRAVENOUS
  Filled 2014-11-09: qty 4

## 2014-11-09 MED ORDER — ACETAMINOPHEN 325 MG PO TABS: 650.0000 mg | ORAL_TABLET | Freq: Four times a day (QID) | ORAL | Status: DC | PRN

## 2014-11-09 MED ORDER — POTASSIUM CHLORIDE CRYS ER 20 MEQ PO TBCR
40.0000 meq | EXTENDED_RELEASE_TABLET | ORAL | Status: AC
  Administered 2014-11-10 (×2): 40 meq via ORAL
  Filled 2014-11-09 (×2): qty 2

## 2014-11-09 MED ORDER — INSULIN GLARGINE 100 UNIT/ML ~~LOC~~ SOLN
60.0000 [IU] | Freq: Two times a day (BID) | SUBCUTANEOUS | Status: DC
  Administered 2014-11-10 (×2): 60 [IU] via SUBCUTANEOUS
  Filled 2014-11-09 (×4): qty 0.6

## 2014-11-09 MED ORDER — ARIPIPRAZOLE 10 MG PO TABS
10.0000 mg | ORAL_TABLET | Freq: Every day | ORAL | Status: DC
  Administered 2014-11-10 – 2014-11-14 (×5): 10 mg via ORAL
  Filled 2014-11-09 (×5): qty 1

## 2014-11-09 MED ORDER — FLUTICASONE PROPIONATE 50 MCG/ACT NA SUSP
2.0000 | Freq: Every day | NASAL | Status: DC
  Administered 2014-11-10 – 2014-11-14 (×5): 2 via NASAL
  Filled 2014-11-09: qty 16

## 2014-11-09 MED ORDER — METHYLPREDNISOLONE SODIUM SUCC 125 MG IJ SOLR
125.0000 mg | Freq: Once | INTRAMUSCULAR | Status: AC
  Administered 2014-11-09: 125 mg via INTRAVENOUS
  Filled 2014-11-09: qty 2

## 2014-11-09 MED ORDER — SENNOSIDES-DOCUSATE SODIUM 8.6-50 MG PO TABS
2.0000 | ORAL_TABLET | Freq: Two times a day (BID) | ORAL | Status: DC
  Administered 2014-11-10 – 2014-11-14 (×10): 2 via ORAL
  Filled 2014-11-09 (×10): qty 2

## 2014-11-09 MED ORDER — MONTELUKAST SODIUM 10 MG PO TABS
10.0000 mg | ORAL_TABLET | Freq: Every day | ORAL | Status: DC
  Administered 2014-11-10 – 2014-11-13 (×5): 10 mg via ORAL
  Filled 2014-11-09 (×5): qty 1

## 2014-11-09 MED ORDER — SODIUM CHLORIDE 0.9 % IJ SOLN
3.0000 mL | INTRAMUSCULAR | Status: DC | PRN
  Administered 2014-11-13 (×3): 3 mL via INTRAVENOUS
  Filled 2014-11-09 (×3): qty 3

## 2014-11-09 MED ORDER — SODIUM CHLORIDE 0.9 % IJ SOLN
3.0000 mL | Freq: Two times a day (BID) | INTRAMUSCULAR | Status: DC
  Administered 2014-11-10 – 2014-11-13 (×8): 3 mL via INTRAVENOUS

## 2014-11-09 MED ORDER — VANCOMYCIN HCL 10 G IV SOLR
2500.0000 mg | Freq: Once | INTRAVENOUS | Status: AC
  Administered 2014-11-10: 2500 mg via INTRAVENOUS
  Filled 2014-11-09: qty 2500

## 2014-11-09 MED ORDER — INSULIN ASPART 100 UNIT/ML ~~LOC~~ SOLN
0.0000 [IU] | Freq: Three times a day (TID) | SUBCUTANEOUS | Status: DC
  Administered 2014-11-10: 7 [IU] via SUBCUTANEOUS

## 2014-11-09 MED ORDER — ACETAMINOPHEN 650 MG RE SUPP: 650.0000 mg | Freq: Four times a day (QID) | RECTAL | Status: DC | PRN

## 2014-11-09 MED ORDER — CARISOPRODOL 350 MG PO TABS
350.0000 mg | ORAL_TABLET | Freq: Four times a day (QID) | ORAL | Status: DC | PRN
  Administered 2014-11-10 – 2014-11-11 (×3): 350 mg via ORAL
  Filled 2014-11-09 (×3): qty 1

## 2014-11-09 MED ORDER — PREDNISOLONE ACETATE 1 % OP SUSP
OPHTHALMIC | Status: AC
  Filled 2014-11-09: qty 5

## 2014-11-09 MED ORDER — PRIMIDONE 50 MG PO TABS
ORAL_TABLET | ORAL | Status: AC
  Filled 2014-11-09: qty 1

## 2014-11-09 NOTE — ED Notes (Signed)
Patient will be going to ICU Room 02, spoke with staff, they are having to call a nurse in and will call for report when s/he arrives. Steward DroneBrenda, ED charge nurse aware.

## 2014-11-09 NOTE — ED Notes (Signed)
Pt c/o of SOB times 3 days, with bilateral leg swelling, neb treatments used at home with no success. 98-99% RA, 1 neb treatment in route.  Patient c/o of chest pain from coughing, 324 asa and 1 sublingual nitro given in route.

## 2014-11-09 NOTE — ED Notes (Signed)
Report given to PattersonBob, in SDU. All questions answered/

## 2014-11-09 NOTE — H&P (Signed)
PCP:   CRIM, [place] DAVID, MD   Chief Complaint:  Shortness of breath  HPI: 64 year old female who   has a past medical history of COPD (chronic obstructive pulmonary disease); Diabetes mellitus without complication; Hypertension; Anxiety; Thyroid disease; Hypoactive thyroid; and Chronic congestive heart failure. Today came to the ED with chief complaint of shortness of breath since this morning, she also has been coughing up clear phlegm. She denies any fever, no nausea vomiting or diarrhea. Patient also complains of pain and redness in the lower extremities. In the ED patient was found to have mild elevation of troponin 0.27, Dr. Fayrene Fearing to ED physician called Dr. Patty Sermons at [facility]. Who recommended to keep the patient at [facility] hospital. Cardiologist not concerned with non-STEMI. Likely from demand ischemia from CHF exacerbation. Patient denies any chest pain, has been complaining of fluttering of muscle in the right chest wall for past few days.  Allergies:   Allergies  Allergen Reactions  . Avelox [Moxifloxacin Hcl In Nacl] Anaphylaxis  . Wellbutrin [Bupropion] Other (See Comments)    Jittery and anxiety      Past Medical History  Diagnosis Date  . COPD (chronic obstructive pulmonary disease)   . Diabetes mellitus without complication   . Hypertension   . Anxiety   . Thyroid disease   . Hypoactive thyroid   . Chronic congestive heart failure     History reviewed. No pertinent past surgical history.  Prior to Admission medications   Medication Sig Start Date End Date Taking? Authorizing Provider  albuterol (PROVENTIL HFA;VENTOLIN HFA) 108 (90 BASE) MCG/ACT inhaler Inhale 1-2 puffs into the lungs every 6 (six) hours as needed for wheezing or shortness of breath.   Yes Historical Provider, MD  ARIPiprazole (ABILIFY) 10 MG tablet Take 10 mg by mouth daily.   Yes Historical Provider, MD  atorvastatin (LIPITOR) 80 MG tablet Take 80 mg by mouth at bedtime.   Yes  Historical Provider, MD  budesonide-formoterol (SYMBICORT) 160-4.5 MCG/ACT inhaler Inhale 2 puffs into the lungs 2 (two) times daily.   Yes Historical Provider, MD  carisoprodol (SOMA) 350 MG tablet Take 350 mg by mouth 4 (four) times daily as needed for muscle spasms.   Yes Historical Provider, MD  Cholecalciferol 1000 UNITS tablet Take 1,000 Units by mouth daily.   Yes Historical Provider, MD  clonazePAM (KLONOPIN) 2 MG tablet Take 2 mg by mouth at bedtime.   Yes Historical Provider, MD  diltiazem (CARDIZEM CD) 120 MG 24 hr capsule Take 120 mg by mouth daily.   Yes Historical Provider, MD  diltiazem (CARDIZEM CD) 120 MG 24 hr capsule Take 120 mg by mouth daily. 06/15/14 02/10/15 Yes Historical Provider, MD  docusate sodium (COLACE) 100 MG capsule Take 100 mg by mouth 2 (two) times daily.   Yes Historical Provider, MD  DULoxetine (CYMBALTA) 60 MG capsule Take 60 mg by mouth daily.   Yes Historical Provider, MD  esomeprazole (NEXIUM) 40 MG capsule Take 40 mg by mouth 2 (two) times daily before a meal.   Yes Historical Provider, MD  furosemide (LASIX) 80 MG tablet Take 80 mg by mouth 2 (two) times daily.   Yes Historical Provider, MD  gabapentin (NEURONTIN) 800 MG tablet Take 800-1,600 mg by mouth 4 (four) times daily. Patient takes 1tab@6am ,1tab@10am ,1tab@2pm ,1tab@6pm ,2tab@@10pm    Yes Historical Provider, MD  levocetirizine (XYZAL) 5 MG tablet Take 5 mg by mouth every evening.   Yes Historical Provider, MD  levothyroxine (SYNTHROID, LEVOTHROID) 25 MCG tablet Take 25  mcg by mouth daily before breakfast.   Yes Historical Provider, MD  Magnesium 500 MG TABS Take 500 mg by mouth daily.   Yes Historical Provider, MD  metFORMIN (GLUCOPHAGE) 1000 MG tablet Take 1,000 mg by mouth 2 (two) times daily with a meal.   Yes Historical Provider, MD  metolazone (ZAROXOLYN) 2.5 MG tablet Take 2.5 mg by mouth 3 (three) times a week.   Yes Historical Provider, MD  mometasone (NASONEX) 50 MCG/ACT nasal spray Place 2  sprays into the nose daily.   Yes Historical Provider, MD  montelukast (SINGULAIR) 10 MG tablet Take 10 mg by mouth at bedtime.   Yes Historical Provider, MD  Olopatadine HCl 0.2 % SOLN Place 1 drop into both eyes daily as needed (itching).   Yes Historical Provider, MD  oxyCODONE (ROXICODONE) 15 MG immediate release tablet Take 15 mg by mouth 4 (four) times daily.   Yes Historical Provider, MD  OxyCODONE HCl ER 60 MG T12A Take 60 mg by mouth 3 (three) times daily.   Yes Historical Provider, MD  potassium chloride SA (K-DUR,KLOR-CON) 20 MEQ tablet Take 20 mEq by mouth daily.   Yes Historical Provider, MD  prednisoLONE acetate (PRED FORTE) 1 % ophthalmic suspension Place 1 drop into both eyes 4 (four) times daily.   Yes Historical Provider, MD  primidone (MYSOLINE) 50 MG tablet Take 50 mg by mouth 2 (two) times daily.   Yes Historical Provider, MD  ramipril (ALTACE) 10 MG capsule Take 10 mg by mouth daily.   Yes Historical Provider, MD  ranitidine (ZANTAC) 150 MG capsule Take 150 mg by mouth 2 (two) times daily.   Yes Historical Provider, MD  senna-docusate (SENOKOT-S) 8.6-50 MG per tablet Take 2 tablets by mouth every 12 (twelve) hours.   Yes Historical Provider, MD  sitaGLIPtin (JANUVIA) 100 MG tablet Take 100 mg by mouth daily.   Yes Historical Provider, MD  spironolactone (ALDACTONE) 50 MG tablet Take 50 mg by mouth daily.   Yes Historical Provider, MD  tiotropium (SPIRIVA) 18 MCG inhalation capsule Place 18 mcg into inhaler and inhale daily.   Yes Historical Provider, MD  zolpidem (AMBIEN CR) 12.5 MG CR tablet Take 12.5 mg by mouth at bedtime.   Yes Historical Provider, MD  insulin glargine (LANTUS) 100 UNIT/ML injection Inject 60 Units into the skin 2 (two) times daily.    Historical Provider, MD  levothyroxine (SYNTHROID, LEVOTHROID) 50 MCG tablet Take 50 mcg by mouth daily. 10/11/14   Historical Provider, MD  NOVOLOG FLEXPEN 100 UNIT/ML FlexPen Inject 10 Units into the skin 3 (three) times  daily before meals. 10/11/14   Historical Provider, MD  Oxycodone HCl 10 MG TABS Take 10 mg by mouth 4 (four) times daily. 11/07/14   Historical Provider, MD    Social History:  reports that she does not drink alcohol. Her tobacco and drug histories are not on file.  Family history- patient's mother had heart problems diagnosed at the age of 68  All the positives are listed in BOLD  Review of Systems:  HEENT: Headache, blurred vision, runny nose, sore throat Neck: Hypothyroidism, hyperthyroidism,,lymphadenopathy Chest : Shortness of breath, history of COPD, Asthma Heart : Chest pain, history of coronary arterey disease GI:  Nausea, vomiting, diarrhea, constipation, GERD GU: Dysuria, urgency, frequency of urination, hematuria Neuro: Stroke, seizures, syncope Psych: Depression, anxiety, hallucinations   Physical Exam: Blood pressure 129/65, pulse 74, temperature 98.6 F (37 C), temperature source Oral, resp. rate 14, height  (1.626 m),  weight 144.697 kg (319 lb), SpO2 89 %. Constitutional:   Patient is a well-developed and well-nourished female* in no acute distress and cooperative with exam. Head: Normocephalic and atraumatic Mouth: Mucus membranes moist Eyes: PERRL, EOMI, conjunctivae normal Neck: Supple, No Thyromegaly Cardiovascular: RRR, S1 normal, S2 normal Pulmonary/Chest: Bilateral diffuse wheezing auscultated in all lung fields Abdominal: Soft. Non-tender, non-distended, bowel sounds are normal, no masses, organomegaly, or guarding present.  Neurological: A&O x3, Strength is normal and symmetric bilaterally, cranial nerve II-XII are grossly intact, no focal motor deficit, sensory intact to light touch bilaterally.  Extremities : Bilateral lower extremity erythema, positive warm to touch, tenderness to palpation in the right lower extremity  Labs on Admission:  Basic Metabolic Panel:  Recent Labs Lab 11/09/14 1708  NA 135  K 3.4*  CL 93*  CO2 35*  GLUCOSE 221*    BUN 16  CREATININE 0.95  CALCIUM 8.2*   Liver Function Tests: No results for input(s): AST, ALT, ALKPHOS, BILITOT, PROT, ALBUMIN in the last 168 hours. No results for input(s): LIPASE, AMYLASE in the last 168 hours. No results for input(s): AMMONIA in the last 168 hours. CBC:  Recent Labs Lab 11/09/14 1708  WBC 9.8  NEUTROABS 8.1*  HGB 13.0  HCT 42.1  MCV 85.4  PLT 237   Cardiac Enzymes:  Recent Labs Lab 11/09/14 1708  TROPONINI 0.27*    BNP (last 3 results)  Recent Labs  11/09/14 1708  BNP 105.0*    ProBNP (last 3 results) No results for input(s): PROBNP in the last 8760 hours.  CBG: No results for input(s): GLUCAP in the last 168 hours.  Radiological Exams on Admission: Dg Chest Port 1 View  11/09/2014   CLINICAL DATA:  Constant moderate shortness of Breath starting 2 days ago. Baseline leg swelling worsening today. Bilateral lower extremity erythema. Diaphoresis and productive cough.  EXAM: PORTABLE CHEST - 1 VIEW  COMPARISON:  06/23/2006  FINDINGS: Shallow inspiration. Cardiac enlargement with mild pulmonary vascular congestion. No edema or consolidation. No blunting of costophrenic angles. No pneumothorax. Calcification of the aorta. Stimulator leads projected over the mid thoracic spine.  IMPRESSION: Cardiac enlargement with mild pulmonary vascular congestion.   Electronically Signed   By: Burman Nieves M.D.   On: 11/09/2014 17:40    EKG: Independently reviewed. Sinus rhythm, left axis deviation   Assessment/Plan Active Problems:   CHF exacerbation   COPD exacerbation   Diabetes mellitus   Lower extremity cellulitis   Hypothyroidism  COPD exacerbation- patient has continuous home oxygen, and now presenting  with COPD exacerbation. We'll start the patient on Solu-Medrol 60 mg IV every 6 hours, DuoNeb's every 6 hours.  CHF exacerbation- patient has mild elevation of troponin, she takes high dose of Lasix at home. BNP is not significantly elevated.  Will continue Lasix 40 g IV every 12 hours. Will check echo cardiac exam in a.m. She has mild elevation of troponin 0.27, will cycle the cardiac enzymes and obtain Cardiology consultation in a.m. Patient is not having any chest pain at this time.  Cellulitis- patient has bilateral lower extremity erythema, warmth and tenderness to palpation. Right more than left. We'll start the patient on vancomycin per pharmacy consultation for cellulitis.  Hypothyroidism- continue Synthroid  Diabetes mellitus- will start sliding scale insulin, continue Lantus  Chronic back pain- patient takes high dose OxyContin 60 mg 3 times a day along with oxycodone 15 mg 4 times a day. Will continue these medications  DVT prophylaxis- Lovenox  Code  status: Full code  Family discussion: No family at bedside   Time Spent on Admission: 60 minutes  LAMA,GAGAN S [facility] Pager: [phone] 11/09/2014, 9:32 PM  If 7PM-7AM, please contact night-coverage  [email or web address]  Password [email or web address]

## 2014-11-09 NOTE — Progress Notes (Signed)
ANTIBIOTIC CONSULT NOTE-Preliminary  Pharmacy Consult for Vancomycin Indication: Cellulitis  Allergies  Allergen Reactions  . Avelox [Moxifloxacin Hcl In Nacl] Anaphylaxis  . Wellbutrin [Bupropion] Other (See Comments)    Jittery and anxiety    Patient Measurements: Height: 5\' 4"  (162.6 cm) Weight: (!) 330 lb 11 oz (150 kg) IBW/kg (Calculated) : 54.7  Vital Signs: Temp: 98.2 F (36.8 C) (03/09 2330) Temp Source: Oral (03/09 2330) BP: 94/71 mmHg (03/09 2330) Pulse Rate: 80 (03/09 2330)  Labs:  Recent Labs  11/09/14 1708  WBC 9.8  HGB 13.0  PLT 237  CREATININE 0.95    Estimated Creatinine Clearance: 90 mL/min (by C-G formula based on Cr of 0.95).  No results for input(s): VANCOTROUGH, VANCOPEAK, VANCORANDOM, GENTTROUGH, GENTPEAK, GENTRANDOM, TOBRATROUGH, TOBRAPEAK, TOBRARND, AMIKACINPEAK, AMIKACINTROU, AMIKACIN in the last 72 hours.   Microbiology: Recent Results (from the past 720 hour(s))  Culture, blood (routine x 2)     Status: None (Preliminary result)   Collection Time: 11/09/14  5:13 PM  Result Value Ref Range Status   Specimen Description RIGHT ANTECUBITAL  Final   Special Requests BOTTLES DRAWN AEROBIC AND ANAEROBIC 8CC  Final   Culture PENDING  Incomplete   Report Status PENDING  Incomplete  Culture, blood (routine x 2)     Status: None (Preliminary result)   Collection Time: 11/09/14  5:20 PM  Result Value Ref Range Status   Specimen Description BLOOD LEFT HAND  Final   Special Requests BOTTLES DRAWN AEROBIC AND ANAEROBIC 6CC  Final   Culture PENDING  Incomplete   Report Status PENDING  Incomplete    Medical History: Past Medical History  Diagnosis Date  . COPD (chronic obstructive pulmonary disease)   . Diabetes mellitus without complication   . Hypertension   . Anxiety   . Thyroid disease   . Hypoactive thyroid   . Chronic congestive heart failure     Medications:   Assessment: 64 yo morbidly obese diabetic female with complaint of  pain and redness in the lower extremities. Vancomycin to be started for cellulitis. Blood cultures pending.  Goal of Therapy:  Vancomycin troughs 10-15 mcg/ml  Plan:  Preliminary review of pertinent patient information completed.  Protocol will be initiated with a one-time dose of Vancomycin 2500 mg IV.  Jeani HawkingAnnie Penn clinical pharmacist will complete review during morning rounds to assess patient and finalize treatment regimen.  Arelia SneddonMason, Jace Fermin Anne, [facility] IncRPH 11/09/2014,11:59 PM

## 2014-11-09 NOTE — ED Provider Notes (Signed)
CSN: [id number]     Arrival date & time 11/09/14  1651 History  This chart was scribed for Rolland Porter, MD by Gwenyth Ober, ED Scribe. This patient was seen in room APA16A/APA16A and the patient's care was started at 4:54 PM.    Chief Complaint  Patient presents with  . Shortness of Breath   The history is provided by the patient. No language interpreter was used.     HPI Comments: Jennifer Mejia is a 65 y.o. female BIBA, with a history of COPD and CHF, who presents to the Emergency Department complaining of constant, moderate SOB that started 2 days ago. Pt states baseline leg swelling that is worse today, erythema to her bilateral legs, diaphoresis and productive cough with gray sputum as associated symptoms. Pt is on 4-5 L/min O2 at home and has tried nebulizer treatments PTA with no relief. She denies CP.   Past Medical History  Diagnosis Date  . COPD (chronic obstructive pulmonary disease)   . Diabetes mellitus without complication   . Hypertension   . Anxiety   . Thyroid disease   . Hypoactive thyroid   . Chronic congestive heart failure    History reviewed. No pertinent past surgical history. History reviewed. No pertinent family history. History  Substance Use Topics  . Smoking status: Not on file  . Smokeless tobacco: Not on file  . Alcohol Use: No   OB History    No data available     Review of Systems  Constitutional: Positive for diaphoresis. Negative for fever, chills, appetite change and fatigue.  HENT: Negative for mouth sores, sore throat and trouble swallowing.   Eyes: Negative for visual disturbance.  Respiratory: Positive for cough and shortness of breath. Negative for chest tightness and wheezing.   Cardiovascular: Positive for leg swelling. Negative for chest pain.  Gastrointestinal: Negative for nausea, vomiting, abdominal pain, diarrhea and abdominal distention.  Endocrine: Negative for polydipsia, polyphagia and polyuria.  Genitourinary: Negative  for dysuria, frequency and hematuria.  Musculoskeletal: Negative for gait problem.  Skin: Negative for color change, pallor and rash.  Neurological: Negative for dizziness, syncope, light-headedness and headaches.  Hematological: Does not bruise/bleed easily.  Psychiatric/Behavioral: Negative for behavioral problems and confusion.  All other systems reviewed and are negative.     Allergies  Avelox and Wellbutrin  Home Medications   Prior to Admission medications   Medication Sig Start Date End Date Taking? Authorizing Provider  albuterol (PROVENTIL HFA;VENTOLIN HFA) 108 (90 BASE) MCG/ACT inhaler Inhale 1-2 puffs into the lungs every 6 (six) hours as needed for wheezing or shortness of breath.   Yes Historical Provider, MD  ARIPiprazole (ABILIFY) 10 MG tablet Take 10 mg by mouth daily.   Yes Historical Provider, MD  atorvastatin (LIPITOR) 80 MG tablet Take 80 mg by mouth at bedtime.   Yes Historical Provider, MD  budesonide-formoterol (SYMBICORT) 160-4.5 MCG/ACT inhaler Inhale 2 puffs into the lungs 2 (two) times daily.   Yes Historical Provider, MD  carisoprodol (SOMA) 350 MG tablet Take 350 mg by mouth 4 (four) times daily as needed for muscle spasms.   Yes Historical Provider, MD  Cholecalciferol 1000 UNITS tablet Take 1,000 Units by mouth daily.   Yes Historical Provider, MD  clonazePAM (KLONOPIN) 2 MG tablet Take 2 mg by mouth at bedtime.   Yes Historical Provider, MD  diltiazem (CARDIZEM CD) 120 MG 24 hr capsule Take 120 mg by mouth daily.   Yes Historical Provider, MD  diltiazem (CARDIZEM CD) 120 MG  24 hr capsule Take 120 mg by mouth daily. 06/15/14 02/10/15 Yes Historical Provider, MD  docusate sodium (COLACE) 100 MG capsule Take 100 mg by mouth 2 (two) times daily.   Yes Historical Provider, MD  DULoxetine (CYMBALTA) 60 MG capsule Take 60 mg by mouth daily.   Yes Historical Provider, MD  esomeprazole (NEXIUM) 40 MG capsule Take 40 mg by mouth 2 (two) times daily before a meal.    Yes Historical Provider, MD  furosemide (LASIX) 80 MG tablet Take 80 mg by mouth 2 (two) times daily.   Yes Historical Provider, MD  gabapentin (NEURONTIN) 800 MG tablet Take 800-1,600 mg by mouth 4 (four) times daily. Patient takes 1tab@6am ,1tab@10am ,1tab@2pm ,1tab@6pm ,2tab@@10pm    Yes Historical Provider, MD  levocetirizine (XYZAL) 5 MG tablet Take 5 mg by mouth every evening.   Yes Historical Provider, MD  levothyroxine (SYNTHROID, LEVOTHROID) 25 MCG tablet Take 25 mcg by mouth daily before breakfast.   Yes Historical Provider, MD  Magnesium 500 MG TABS Take 500 mg by mouth daily.   Yes Historical Provider, MD  metFORMIN (GLUCOPHAGE) 1000 MG tablet Take 1,000 mg by mouth 2 (two) times daily with a meal.   Yes Historical Provider, MD  metolazone (ZAROXOLYN) 2.5 MG tablet Take 2.5 mg by mouth 3 (three) times a week.   Yes Historical Provider, MD  mometasone (NASONEX) 50 MCG/ACT nasal spray Place 2 sprays into the nose daily.   Yes Historical Provider, MD  montelukast (SINGULAIR) 10 MG tablet Take 10 mg by mouth at bedtime.   Yes Historical Provider, MD  Olopatadine HCl 0.2 % SOLN Place 1 drop into both eyes daily as needed (itching).   Yes Historical Provider, MD  oxyCODONE (ROXICODONE) 15 MG immediate release tablet Take 15 mg by mouth 4 (four) times daily.   Yes Historical Provider, MD  OxyCODONE HCl ER 60 MG T12A Take 60 mg by mouth 3 (three) times daily.   Yes Historical Provider, MD  potassium chloride SA (K-DUR,KLOR-CON) 20 MEQ tablet Take 20 mEq by mouth daily.   Yes Historical Provider, MD  prednisoLONE acetate (PRED FORTE) 1 % ophthalmic suspension Place 1 drop into both eyes 4 (four) times daily.   Yes Historical Provider, MD  primidone (MYSOLINE) 50 MG tablet Take 50 mg by mouth 2 (two) times daily.   Yes Historical Provider, MD  ramipril (ALTACE) 10 MG capsule Take 10 mg by mouth daily.   Yes Historical Provider, MD  ranitidine (ZANTAC) 150 MG capsule Take 150 mg by mouth 2 (two) times  daily.   Yes Historical Provider, MD  senna-docusate (SENOKOT-S) 8.6-50 MG per tablet Take 2 tablets by mouth every 12 (twelve) hours.   Yes Historical Provider, MD  sitaGLIPtin (JANUVIA) 100 MG tablet Take 100 mg by mouth daily.   Yes Historical Provider, MD  spironolactone (ALDACTONE) 50 MG tablet Take 50 mg by mouth daily.   Yes Historical Provider, MD  tiotropium (SPIRIVA) 18 MCG inhalation capsule Place 18 mcg into inhaler and inhale daily.   Yes Historical Provider, MD  zolpidem (AMBIEN CR) 12.5 MG CR tablet Take 12.5 mg by mouth at bedtime.   Yes Historical Provider, MD  insulin glargine (LANTUS) 100 UNIT/ML injection Inject 60 Units into the skin 2 (two) times daily.    Historical Provider, MD  levothyroxine (SYNTHROID, LEVOTHROID) 50 MCG tablet Take 50 mcg by mouth daily. 10/11/14   Historical Provider, MD  NOVOLOG FLEXPEN 100 UNIT/ML FlexPen Inject 10 Units into the skin 3 (three) times daily before meals.  10/11/14   Historical Provider, MD  Oxycodone HCl 10 MG TABS Take 10 mg by mouth 4 (four) times daily. 11/07/14   Historical Provider, MD   BP 111/56 mmHg  Pulse 77  Temp(Src) 98.4 F (36.9 C) (Oral)  Resp 18  Ht 5\' 4"  (1.626 m)  Wt 319 lb (144.697 kg)  BMI 54.73 kg/m2  SpO2 90% Physical Exam  Constitutional: She is oriented to person, place, and time. She appears well-developed and well-nourished. No distress.  Dyspneic with conversation; morbid obesity  HENT:  Head: Normocephalic.  Eyes: Conjunctivae are normal. Pupils are equal, round, and reactive to light. No scleral icterus.  Neck: Normal range of motion. Neck supple. No thyromegaly present.  Cardiovascular: Normal rate and regular rhythm.  Exam reveals no gallop and no friction rub.   No murmur heard. Pulmonary/Chest: Effort normal. No respiratory distress. She has wheezes. She has no rales.  Inspiratory and expiratory wheezing; prolonged in every field  Abdominal: Soft. Bowel sounds are normal. She exhibits no distension.  There is no tenderness. There is no rebound.  Musculoskeletal: Normal range of motion. She exhibits edema.  2+ swelling to bilateral extremities; erythema to shin and knees bilaterally with streak from medial right thigh to groin; desquamation of skin   Neurological: She is alert and oriented to person, place, and time.  Skin: Skin is warm and dry. No rash noted.  Psychiatric: She has a normal mood and affect. Her behavior is normal.  Nursing note and vitals reviewed.   ED Course  Procedures (including critical care time)  DIAGNOSTIC STUDIES: Oxygen Saturation is 89% on 5 L/min, low by my interpretation.    COORDINATION OF CARE: 5:05 PM Discussed treatment plan with pt at bedside and pt agreed to plan.  Labs Review Labs Reviewed  CBC WITH DIFFERENTIAL/PLATELET - Abnormal; Notable for the following:    RDW 17.0 (*)    Neutrophils Relative % 83 (*)    Neutro Abs 8.1 (*)    Lymphocytes Relative 8 (*)    All other components within normal limits  BASIC METABOLIC PANEL - Abnormal; Notable for the following:    Potassium 3.4 (*)    Chloride 93 (*)    CO2 35 (*)    Glucose, Bld 221 (*)    Calcium 8.2 (*)    GFR calc non Af Amer 63 (*)    GFR calc Af Amer 73 (*)    All other components within normal limits  TROPONIN I - Abnormal; Notable for the following:    Troponin I 0.27 (*)    All other components within normal limits  BRAIN NATRIURETIC PEPTIDE - Abnormal; Notable for the following:    B Natriuretic Peptide 105.0 (*)    All other components within normal limits  URINALYSIS, ROUTINE W REFLEX MICROSCOPIC - Abnormal; Notable for the following:    Specific Gravity, Urine >1.030 (*)    All other components within normal limits  URINE CULTURE  CULTURE, BLOOD (ROUTINE X 2)  CULTURE, BLOOD (ROUTINE X 2)    Imaging Review Dg Chest Port 1 View  11/09/2014   CLINICAL DATA:  Constant moderate shortness of Breath starting 2 days ago. Baseline leg swelling worsening today. Bilateral  lower extremity erythema. Diaphoresis and productive cough.  EXAM: PORTABLE CHEST - 1 VIEW  COMPARISON:  06/23/2006  FINDINGS: Shallow inspiration. Cardiac enlargement with mild pulmonary vascular congestion. No edema or consolidation. No blunting of costophrenic angles. No pneumothorax. Calcification of the aorta. Stimulator leads projected over  the mid thoracic spine.  IMPRESSION: Cardiac enlargement with mild pulmonary vascular congestion.   Electronically Signed   By: Burman NievesWilliam  Stevens M.D.   On: 11/09/2014 17:40     EKG Interpretation None      MDM   Final diagnoses:  SOB (shortness of breath)    I personally performed the services described in this documentation, which was scribed in my presence. The recorded information has been reviewed and is accurate.   Historically, and clinically patient's symptoms most consistent with cor pulmonale. History of home O2 dependent COPD with exacerbation and wheezing on exam. Bilateral extremity edema. Would appear to have a cellulitis with lymphangitis of the right leg. Blood cultures obtained. Antiemetics requested. Patient given nebulized albuterol. Given IV Lasix. Chest x-ray suggests pulmonary edema.  Acute changes on EKG. No ST changes. No Q waves. However, troponin elevated 0.27. BNP only 105. She has diarrhea shortly later. Clinically appears better. Less dyspneic. Did discuss case with Dr. Patty SermonsBrackbill considering the elevated troponin. He felt that she could be maintained and evaluated. Jeani HawkingAnnie Penn by cardiology referral. Also please call hospitalist regarding admission.   Rolland PorterMark Day Deery, MD 11/14/14 [phone]

## 2014-11-10 ENCOUNTER — Encounter (HOSPITAL_COMMUNITY): Payer: Self-pay | Admitting: Cardiology

## 2014-11-10 DIAGNOSIS — L03119 Cellulitis of unspecified part of limb: Secondary | ICD-10-CM

## 2014-11-10 DIAGNOSIS — I248 Other forms of acute ischemic heart disease: Secondary | ICD-10-CM

## 2014-11-10 DIAGNOSIS — I509 Heart failure, unspecified: Secondary | ICD-10-CM

## 2014-11-10 DIAGNOSIS — I890 Lymphedema, not elsewhere classified: Secondary | ICD-10-CM

## 2014-11-10 DIAGNOSIS — E1140 Type 2 diabetes mellitus with diabetic neuropathy, unspecified: Secondary | ICD-10-CM

## 2014-11-10 LAB — TROPONIN I
TROPONIN I: 0.16 ng/mL — AB (ref <0.031)
Troponin I: 0.2 ng/mL — ABNORMAL HIGH (ref <0.031)
Troponin I: 0.1 ng/mL — ABNORMAL HIGH (ref <0.031)

## 2014-11-10 LAB — CBC
HCT: 43.6 % (ref 36.0–46.0)
Hemoglobin: 13.5 g/dL (ref 12.0–15.0)
MCH: 26.3 pg (ref 26.0–34.0)
MCHC: 31 g/dL (ref 30.0–36.0)
MCV: 84.8 fL (ref 78.0–100.0)
PLATELETS: 198 10*3/uL (ref 150–400)
RBC: 5.14 MIL/uL — ABNORMAL HIGH (ref 3.87–5.11)
RDW: 16.5 % — ABNORMAL HIGH (ref 11.5–15.5)
WBC: 10 10*3/uL (ref 4.0–10.5)

## 2014-11-10 LAB — COMPREHENSIVE METABOLIC PANEL
ALBUMIN: 3.2 g/dL — AB (ref 3.5–5.2)
ALK PHOS: 81 U/L (ref 39–117)
ALT: 13 U/L (ref 0–35)
ANION GAP: 8 (ref 5–15)
AST: 17 U/L (ref 0–37)
BILIRUBIN TOTAL: 0.6 mg/dL (ref 0.3–1.2)
BUN: 19 mg/dL (ref 6–23)
CHLORIDE: 92 mmol/L — AB (ref 96–112)
CO2: 32 mmol/L (ref 19–32)
Calcium: 8 mg/dL — ABNORMAL LOW (ref 8.4–10.5)
Creatinine, Ser: 0.85 mg/dL (ref 0.50–1.10)
GFR calc Af Amer: 83 mL/min — ABNORMAL LOW (ref >90)
GFR calc non Af Amer: 72 mL/min — ABNORMAL LOW (ref >90)
GLUCOSE: 343 mg/dL — AB (ref 70–99)
Potassium: 4.6 mmol/L (ref 3.5–5.1)
Sodium: 132 mmol/L — ABNORMAL LOW (ref 135–145)
Total Protein: 6.5 g/dL (ref 6.0–8.3)

## 2014-11-10 LAB — BASIC METABOLIC PANEL
Anion gap: 6 (ref 5–15)
BUN: 27 mg/dL — ABNORMAL HIGH (ref 6–23)
CO2: 33 mmol/L — ABNORMAL HIGH (ref 19–32)
Calcium: 7.9 mg/dL — ABNORMAL LOW (ref 8.4–10.5)
Chloride: 90 mmol/L — ABNORMAL LOW (ref 96–112)
Creatinine, Ser: 1.25 mg/dL — ABNORMAL HIGH (ref 0.50–1.10)
GFR calc Af Amer: 52 mL/min — ABNORMAL LOW (ref >90)
GFR calc non Af Amer: 45 mL/min — ABNORMAL LOW (ref >90)
Glucose, Bld: 459 mg/dL — ABNORMAL HIGH (ref 70–99)
Potassium: 5.3 mmol/L — ABNORMAL HIGH (ref 3.5–5.1)
Sodium: 129 mmol/L — ABNORMAL LOW (ref 135–145)

## 2014-11-10 LAB — GLUCOSE, CAPILLARY
GLUCOSE-CAPILLARY: 334 mg/dL — AB (ref 70–99)
GLUCOSE-CAPILLARY: 405 mg/dL — AB (ref 70–99)
Glucose-Capillary: 329 mg/dL — ABNORMAL HIGH (ref 70–99)
Glucose-Capillary: 405 mg/dL — ABNORMAL HIGH (ref 70–99)

## 2014-11-10 LAB — MRSA PCR SCREENING: MRSA by PCR: NEGATIVE

## 2014-11-10 MED ORDER — RACEPINEPHRINE HCL 2.25 % IN NEBU
INHALATION_SOLUTION | RESPIRATORY_TRACT | Status: AC
  Filled 2014-11-10: qty 0.5

## 2014-11-10 MED ORDER — RACEPINEPHRINE HCL 2.25 % IN NEBU
0.5000 mL | INHALATION_SOLUTION | RESPIRATORY_TRACT | Status: DC | PRN
  Administered 2014-11-10 (×2): 0.5 mL via RESPIRATORY_TRACT
  Filled 2014-11-10: qty 0.5

## 2014-11-10 MED ORDER — VANCOMYCIN HCL 500 MG IV SOLR
INTRAVENOUS | Status: AC
  Filled 2014-11-10: qty 500

## 2014-11-10 MED ORDER — FUROSEMIDE 10 MG/ML IJ SOLN
80.0000 mg | Freq: Two times a day (BID) | INTRAMUSCULAR | Status: DC
  Administered 2014-11-10 – 2014-11-12 (×4): 80 mg via INTRAVENOUS
  Filled 2014-11-10 (×4): qty 8

## 2014-11-10 MED ORDER — INSULIN ASPART 100 UNIT/ML ~~LOC~~ SOLN: 20.0000 [IU] | Freq: Once | SUBCUTANEOUS | Status: AC

## 2014-11-10 MED ORDER — INSULIN ASPART 100 UNIT/ML ~~LOC~~ SOLN
0.0000 [IU] | Freq: Three times a day (TID) | SUBCUTANEOUS | Status: DC
  Administered 2014-11-11 – 2014-11-13 (×8): 20 [IU] via SUBCUTANEOUS
  Administered 2014-11-14: 15 [IU] via SUBCUTANEOUS
  Administered 2014-11-14: 20 [IU] via SUBCUTANEOUS

## 2014-11-10 MED ORDER — INSULIN GLARGINE 100 UNIT/ML ~~LOC~~ SOLN
65.0000 [IU] | Freq: Two times a day (BID) | SUBCUTANEOUS | Status: DC
  Administered 2014-11-10 – 2014-11-11 (×2): 65 [IU] via SUBCUTANEOUS
  Filled 2014-11-10 (×8): qty 0.65

## 2014-11-10 MED ORDER — VANCOMYCIN HCL IN DEXTROSE 1-5 GM/200ML-% IV SOLN
1000.0000 mg | Freq: Three times a day (TID) | INTRAVENOUS | Status: DC
  Administered 2014-11-10 – 2014-11-11 (×4): 1000 mg via INTRAVENOUS
  Filled 2014-11-10 (×13): qty 200

## 2014-11-10 MED ORDER — RACEPINEPHRINE HCL 2.25 % IN NEBU
0.5000 mL | INHALATION_SOLUTION | RESPIRATORY_TRACT | Status: AC
  Administered 2014-11-10 – 2014-11-11 (×2): 0.5 mL via RESPIRATORY_TRACT
  Filled 2014-11-10 (×2): qty 0.5

## 2014-11-10 MED ORDER — METHYLPREDNISOLONE SODIUM SUCC 125 MG IJ SOLR
80.0000 mg | Freq: Four times a day (QID) | INTRAMUSCULAR | Status: DC
  Administered 2014-11-10 – 2014-11-13 (×12): 80 mg via INTRAVENOUS
  Filled 2014-11-10 (×12): qty 2

## 2014-11-10 MED ORDER — INSULIN ASPART 100 UNIT/ML ~~LOC~~ SOLN
6.0000 [IU] | Freq: Three times a day (TID) | SUBCUTANEOUS | Status: DC
  Administered 2014-11-10 – 2014-11-11 (×2): 6 [IU] via SUBCUTANEOUS

## 2014-11-10 MED ORDER — HYDROMORPHONE HCL 1 MG/ML IJ SOLN
1.0000 mg | INTRAMUSCULAR | Status: DC | PRN
  Administered 2014-11-10 – 2014-11-14 (×9): 1 mg via INTRAVENOUS
  Filled 2014-11-10 (×9): qty 1

## 2014-11-10 MED ORDER — VANCOMYCIN HCL 1000 MG IV SOLR
INTRAVENOUS | Status: AC
  Filled 2014-11-10: qty 2000

## 2014-11-10 MED ORDER — INSULIN ASPART 100 UNIT/ML ~~LOC~~ SOLN: 0.0000 [IU] | Freq: Every day | SUBCUTANEOUS | Status: DC

## 2014-11-10 MED ORDER — INSULIN ASPART 100 UNIT/ML ~~LOC~~ SOLN
0.0000 [IU] | Freq: Every day | SUBCUTANEOUS | Status: DC
  Administered 2014-11-11 – 2014-11-13 (×2): 5 [IU] via SUBCUTANEOUS

## 2014-11-10 MED ORDER — INSULIN ASPART 100 UNIT/ML ~~LOC~~ SOLN
15.0000 [IU] | Freq: Once | SUBCUTANEOUS | Status: AC
  Administered 2014-11-10: 15 [IU] via SUBCUTANEOUS

## 2014-11-10 MED ORDER — INSULIN ASPART 100 UNIT/ML ~~LOC~~ SOLN
0.0000 [IU] | Freq: Three times a day (TID) | SUBCUTANEOUS | Status: DC
Start: 2014-11-10 — End: 2014-11-10

## 2014-11-10 MED ORDER — ENOXAPARIN SODIUM 80 MG/0.8ML ~~LOC~~ SOLN
75.0000 mg | SUBCUTANEOUS | Status: DC
  Administered 2014-11-11 – 2014-11-14 (×4): 75 mg via SUBCUTANEOUS
  Filled 2014-11-10 (×4): qty 0.8

## 2014-11-10 MED ORDER — INSULIN ASPART 100 UNIT/ML ~~LOC~~ SOLN
0.0000 [IU] | Freq: Three times a day (TID) | SUBCUTANEOUS | Status: DC
  Administered 2014-11-10 (×2): 20 [IU] via SUBCUTANEOUS

## 2014-11-10 NOTE — Progress Notes (Signed)
ANTIBIOTIC CONSULT NOTE-follow up  Pharmacy Consult for Vancomycin Indication: Cellulitis  Allergies  Allergen Reactions  . Avelox [Moxifloxacin Hcl In Nacl] Anaphylaxis  . Wellbutrin [Bupropion] Other (See Comments)    Jittery and anxiety   Patient Measurements: Height: 5\' 4"  (162.6 cm) Weight: (!) 330 lb 11 oz (150 kg) IBW/kg (Calculated) : 54.7  Vital Signs: Temp: 98.2 F (36.8 C) (03/10 0748) Temp Source: Oral (03/10 0748) BP: 112/51 mmHg (03/10 0600) Pulse Rate: 65 (03/10 0600)  Labs:  Recent Labs  11/09/14 1708 11/10/14 0450  WBC 9.8 10.0  HGB 13.0 13.5  PLT 237 198  CREATININE 0.95 0.85   Estimated Creatinine Clearance: 100.5 mL/min (by C-G formula based on Cr of 0.85).  No results for input(s): VANCOTROUGH, VANCOPEAK, VANCORANDOM, GENTTROUGH, GENTPEAK, GENTRANDOM, TOBRATROUGH, TOBRAPEAK, TOBRARND, AMIKACINPEAK, AMIKACINTROU, AMIKACIN in the last 72 hours.   Microbiology: Recent Results (from the past 720 hour(s))  Culture, blood (routine x 2)     Status: None (Preliminary result)   Collection Time: 11/09/14  5:13 PM  Result Value Ref Range Status   Specimen Description RIGHT ANTECUBITAL  Final   Special Requests BOTTLES DRAWN AEROBIC AND ANAEROBIC 8CC  Final   Culture PENDING  Incomplete   Report Status PENDING  Incomplete  Culture, blood (routine x 2)     Status: None (Preliminary result)   Collection Time: 11/09/14  5:20 PM  Result Value Ref Range Status   Specimen Description BLOOD LEFT HAND  Final   Special Requests BOTTLES DRAWN AEROBIC AND ANAEROBIC 6CC  Final   Culture PENDING  Incomplete   Report Status PENDING  Incomplete  MRSA PCR Screening     Status: None   Collection Time: 11/09/14 11:30 PM  Result Value Ref Range Status   MRSA by PCR NEGATIVE NEGATIVE Final    Comment:        The GeneXpert MRSA Assay (FDA approved for NASAL specimens only), is one component of a comprehensive MRSA colonization surveillance program. It is  not intended to diagnose MRSA infection nor to guide or monitor treatment for MRSA infections.    Medical History: Past Medical History  Diagnosis Date  . COPD (chronic obstructive pulmonary disease)   . Type 2 diabetes mellitus with peripheral neuropathy   . Essential hypertension   . Anxiety   . Hypothyroidism   . Chronic diastolic heart failure   . History of cardiac catheterization 2010    Normal coronaries  . OSA (obstructive sleep apnea)   . Chronic venous insufficiency   . Obesity   . History of cellulitis   . Lymphedema   . Chronic pain   . GERD (gastroesophageal reflux disease)    Assessment: 64 yo morbidly obese diabetic female with complaint of pain and redness in the lower extremities. Vancomycin to be started for cellulitis. Blood cultures pending.  Pt has excellent renal fxn.  Estimated Creatinine Clearance: 100.5 mL/min (by C-G formula based on Cr of 0.85).  Normalized ClCr estimated ~ 75-6080ml/min  Goal of Therapy:  Vancomycin troughs 10-15 mcg/ml  Plan:  Preliminary review of pertinent patient information completed.  Protocol was initiated with a one-time dose of Vancomycin 2500 mg IV on 3/10 ~ 1am.    Initiate Vancomycin 1000mg  IV q8h (next dose at 9am today)  Check vancomycin trough level at steady state  Monitor labs, renal fxn, and cultures  Deescalate ABX when improved / appropriate  Valrie HartHall, Meeah Totino A, RPH 11/10/2014,7:59 AM

## 2014-11-10 NOTE — Clinical Documentation Improvement (Signed)
Pt with documented history of chronic respiratory failure on home oxygen at 4L, now requiring non-rebreather mask at 15 L/min; acute on chronic diastolic heart failure and COPD exacerbation.  Please identify any additional clinical conditions associated with the increased oxygen requirements, if any, and document in your progress note and discharge summary.    Possible Clinical Conditions: -Acute on chronic respiratory failure -Chronic respiratory failure only (as currently documented) -Other condition (please specify)  Thank you, Doy MinceVangela Emmalea Treanor, RN [phone] Clinical Documentation Specialist

## 2014-11-10 NOTE — Progress Notes (Signed)
[facility] PROGRESS NOTE  Jennifer Mejia ZOX:[id number] DOB: 01-18-1951 DOA: 11/09/2014 PCP: CRIM, [place] DAVID, MD  Assessment/Plan: Acute Hypoxemic Respiratory Failure -2/2 COPD and CHF (see below for details). -Currently requiring a NRB.  COPD with Acute Exacerbation -Has significant wheezing on exam today. -Will increase steroid dose, add racemic epi nebs, PRN albuterol.  Acute on Chronic Diastolic CHF -Increase lasix dose. -Strive for negative fluid balance. -ECHO pending.  Demand Ischemia -Troponins stable around 0.20. -Likely related to ongoing pulmonary issues. -no cardiac work up planned for at present. -ECHO pending. -Appreciate cardiology assistance.  ?bilateral LE Cellulitis -not convinced this is cellulitis. -looks more like venous insufficiency to me. -Will keep vancomycin today and consider DC in the next 24 hours, unless drastic improvement.  Morbid Obesity  DM -Check CBGs.  Code Status: Full Code Family Communication: friends at bedside  Disposition Plan: Home when ready   Consultants:  None   Antibiotics:  Vancomycin   Subjective: SOB.  Objective: Filed Vitals:   11/10/14 1200 11/10/14 1300 11/10/14 1400 11/10/14 1525  BP: 104/55 119/55 99/52   Pulse: 66 67 57   Temp:      TempSrc:      Resp: Height:      Weight:      SpO2: 95% 97% 97% 96%    Intake/Output Summary (Last 24 hours) at 11/10/14 1602 Last data filed at 11/10/14 1400  Gross per 24 hour  Intake 2030.5 ml  Output   1200 ml  Net  830.5 ml   Filed Weights   11/09/14 1656 11/09/14 2330 11/10/14 0500  Weight: 144.697 kg (319 lb) 150 kg (330 lb 11 oz) 150 kg (330 lb 11 oz)    Exam:   General:  AA Ox3  Cardiovascular: RRR  Respiratory: Diffuse expiratory wheezing  Abdomen: S/NT/ND/+BS  Extremities: 2-3++ edema, redness over bilateral lower extremities from ankles to right below knees.   Neurologic:  Non-focal  Data Reviewed: Basic  Metabolic Panel:  Recent Labs Lab 11/09/14 1708 11/10/14 0450  NA 135 132*  K 3.4* 4.6  CL 93* 92*  CO2 35* 32  GLUCOSE 221* 343*  BUN 16 19  CREATININE 0.95 0.85  CALCIUM 8.2* 8.0*   Liver Function Tests:  Recent Labs Lab 11/10/14 0450  AST 17  ALT 13  ALKPHOS 81  BILITOT 0.6  PROT 6.5  ALBUMIN 3.2*   No results for input(s): LIPASE, AMYLASE in the last 168 hours. No results for input(s): AMMONIA in the last 168 hours. CBC:  Recent Labs Lab 11/09/14 1708 11/10/14 0450  WBC 9.8 10.0  NEUTROABS 8.1*  --   HGB 13.0 13.5  HCT 42.1 43.6  MCV 85.4 84.8  PLT 237 198   Cardiac Enzymes:  Recent Labs Lab 11/09/14 1708 11/09/14 2334 11/10/14 0450 11/10/14 1250  TROPONINI 0.27* 0.16* 0.20* 0.10*   BNP (last 3 results)  Recent Labs  11/09/14 1708  BNP 105.0*    ProBNP (last 3 results) No results for input(s): PROBNP in the last 8760 hours.  CBG: No results for input(s): GLUCAP in the last 168 hours.  Recent Results (from the past 240 hour(s))  Culture, blood (routine x 2)     Status: None (Preliminary result)   Collection Time: 11/09/14  5:13 PM  Result Value Ref Range Status   Specimen Description BLOOD RIGHT ANTECUBITAL  Final   Special Requests BOTTLES DRAWN AEROBIC AND ANAEROBIC 8CC  Final   Culture NO GROWTH  1 DAY  Final   Report Status PENDING  Incomplete  Culture, blood (routine x 2)     Status: None (Preliminary result)   Collection Time: 11/09/14  5:20 PM  Result Value Ref Range Status   Specimen Description BLOOD LEFT HAND  Final   Special Requests BOTTLES DRAWN AEROBIC AND ANAEROBIC 6CC  Final   Culture NO GROWTH 1 DAY  Final   Report Status PENDING  Incomplete  MRSA PCR Screening     Status: None   Collection Time: 11/09/14 11:30 PM  Result Value Ref Range Status   MRSA by PCR NEGATIVE NEGATIVE Final    Comment:        The GeneXpert MRSA Assay (FDA approved for NASAL specimens only), is one component of a comprehensive MRSA  colonization surveillance program. It is not intended to diagnose MRSA infection nor to guide or monitor treatment for MRSA infections.      Studies: Dg Chest Port 1 View  11/09/2014   CLINICAL DATA:  Constant moderate shortness of Breath starting 2 days ago. Baseline leg swelling worsening today. Bilateral lower extremity erythema. Diaphoresis and productive cough.  EXAM: PORTABLE CHEST - 1 VIEW  COMPARISON:  06/23/2006  FINDINGS: Shallow inspiration. Cardiac enlargement with mild pulmonary vascular congestion. No edema or consolidation. No blunting of costophrenic angles. No pneumothorax. Calcification of the aorta. Stimulator leads projected over the mid thoracic spine.  IMPRESSION: Cardiac enlargement with mild pulmonary vascular congestion.   Electronically Signed   By: Burman NievesWilliam  Stevens M.D.   On: 11/09/2014 17:40    Scheduled Meds: . ARIPiprazole  10 mg Oral Daily  . atorvastatin  80 mg Oral QHS  . budesonide-formoterol  2 puff Inhalation BID  . clonazePAM  2 mg Oral QHS  . diltiazem  120 mg Oral Daily  . docusate sodium  100 mg Oral BID  . DULoxetine  60 mg Oral Daily  . [START ON 11/11/2014] enoxaparin (LOVENOX) injection  75 mg Subcutaneous Q24H  . fluticasone  2 spray Each Nare Daily  . furosemide  80 mg Intravenous Q12H  . gabapentin  1,600 mg Oral QHS  . gabapentin  800 mg Oral 4 times per day  . insulin aspart  0-20 Units Subcutaneous TID WC  . insulin aspart  0-5 Units Subcutaneous QHS  . insulin glargine  60 Units Subcutaneous BID  . ipratropium-albuterol  3 mL Nebulization Q6H  . levothyroxine  50 mcg Oral QAC breakfast  . loratadine  10 mg Oral QPC supper  . methylPREDNISolone (SOLU-MEDROL) injection  80 mg Intravenous Q6H  . montelukast  10 mg Oral QHS  . OxyCODONE  60 mg Oral TID  . pantoprazole  40 mg Oral BID  . primidone  50 mg Oral BID  . Racepinephrine HCl  0.5 mL Nebulization Q4H  . ramipril  10 mg Oral Daily  . senna-docusate  2 tablet Oral Q12H  .  sodium chloride  3 mL Intravenous Q12H  . spironolactone  50 mg Oral Daily  . vancomycin  1,000 mg Intravenous Q8H   Continuous Infusions:   Active Problems:   CHF exacerbation   COPD exacerbation   Diabetes mellitus   Cellulitis    Time spent: 35 minutes. Greater than 50% of this time was spent in direct contact with the patient coordinating care.    Chaya JanHERNANDEZ ACOSTA,ESTELA  [facility] Pager [phone]  If 7PM-7AM, please contact night-coverage at [email or web address], password [facility] 11/10/2014, 4:02 PM  LOS: 1 day

## 2014-11-10 NOTE — Progress Notes (Signed)
Patient was sleeping.

## 2014-11-10 NOTE — Progress Notes (Signed)
*  PRELIMINARY RESULTS* Echocardiogram 2D Echocardiogram has been performed.  Jeryl ColumbiaLLIOTT, Thalia Turkington 11/10/2014, 10:08 AM

## 2014-11-10 NOTE — Progress Notes (Signed)
MD gave RT verbal orders for racemic epi nebulizer Q4 for 4 doses to try to help with severe stridor and wheezing.  Medication started today at 1100.

## 2014-11-10 NOTE — Consult Note (Signed)
Primary cardiologist: Dr. Launa Flight ([facility]) Consulting cardiologist: Dr. Jonelle Sidle  Reason for consultation: Abnormal troponin I  Clinical Summary Jennifer Mejia is a medically complex 64 y.o.female with past medical history outlined below, followed by providers within the [facility] Everywhere reviewed). She was brought to the [facility] ER via ambulance with increasing shortness of breath, tells me today that this has been going on for about a week, also complaining of increasing leg edema and redness. She is on chronic diuretic therapy, has a known history of leg cellulitis and lymphedema, states that she sometimes misses her metolazone. She has been admitted for further management on the hospitalist team. Chest x-ray does not demonstrate any infiltrates, but does show mild pulmonary vascular congestion. BNP level 105, troponin I levels abnormal in a relatively flat pattern of 0.27, 0.16, and 0.20. ECG shows sinus rhythm with low voltage and leftward axis, nonspecific T-wave changes, lead motion artifact.  She has a history of diastolic heart failure with preserved systolic function by echocardiogram in 2015. She also has a history of normal coronary arteries by cardiac catheterization in 2010. She does not endorse any chest pressure, no cough, fevers or chills. She states that she "has a lot of things" wrong with her, cannot always remember the details. She does use oxygen at home, 4 L chronically.   Allergies  Allergen Reactions  . Avelox [Moxifloxacin Hcl In Nacl] Anaphylaxis  . Wellbutrin [Bupropion] Other (See Comments)    Jittery and anxiety    Medications Scheduled Medications: . ARIPiprazole  10 mg Oral Daily  . atorvastatin  80 mg Oral QHS  . budesonide-formoterol  2 puff Inhalation BID  . clonazePAM  2 mg Oral QHS  . diltiazem  120 mg Oral Daily  . docusate sodium  100 mg Oral BID  . DULoxetine  60 mg Oral Daily  . enoxaparin (LOVENOX)  injection  40 mg Subcutaneous Q24H  . fluticasone  2 spray Each Nare Daily  . furosemide  40 mg Intravenous Q12H  . gabapentin  1,600 mg Oral QHS  . gabapentin  800 mg Oral 4 times per day  . insulin aspart  0-9 Units Subcutaneous TID WC  . insulin glargine  60 Units Subcutaneous BID  . ipratropium-albuterol  3 mL Nebulization Q6H  . levothyroxine  50 mcg Oral QAC breakfast  . loratadine  10 mg Oral QPC supper  . methylPREDNISolone (SOLU-MEDROL) injection  60 mg Intravenous Q6H  . montelukast  10 mg Oral QHS  . OxyCODONE  60 mg Oral TID  . pantoprazole  40 mg Oral BID  . prednisoLONE acetate  1 drop Both Eyes QID  . primidone  50 mg Oral BID  . ramipril  10 mg Oral Daily  . senna-docusate  2 tablet Oral Q12H  . sodium chloride  3 mL Intravenous Q12H  . spironolactone  50 mg Oral Daily  . vancomycin  1,000 mg Intravenous Q8H      PRN Medications:  sodium chloride, acetaminophen **OR** acetaminophen, carisoprodol, ondansetron **OR** ondansetron (ZOFRAN) IV, oxyCODONE, sodium chloride, zolpidem   Past Medical History  Diagnosis Date  . COPD (chronic obstructive pulmonary disease)   . Type 2 diabetes mellitus with peripheral neuropathy   . Essential hypertension   . Anxiety   . Hypothyroidism   . Chronic diastolic heart failure     Dr. Launa Flight ([facility])  . History of cardiac catheterization 2010    Normal coronaries ([facility])  . OSA (obstructive sleep apnea)   . Chronic venous insufficiency   . Obesity   . History of cellulitis   . Lymphedema   . Chronic pain   . GERD (gastroesophageal reflux disease)     Past Surgical History  Procedure Laterality Date  . Spinal cord stimulator implant  2012   . Total knee arthroplasty Bilateral 2006 and 2008  . Abdominal hernia repair    . Wrist surgery    . Appendectomy    . Tonsillectomy    . Abdominal hysterectomy    . Eye surgery    . Reflux surgery      Family History  Problem Relation Age of Onset  . CAD  Mother   . Diabetes Mellitus II Sister   . Diabetes Mellitus II Mother   . Hypertension Father   . Liver disease Mother     Social History Jennifer Mejia reports that she has been smoking Cigarettes.  She does not have any smokeless tobacco history on file. Jennifer Mejia reports that she does not drink alcohol.  Review of Systems Complete review of systems negative except as otherwise outlined in the clinical summary and also the following. Reports some blistering on her legs. Chronic back pain.  Physical Examination Blood pressure 112/51, pulse 65, temperature 98.2 F (36.8 C), temperature source Oral, resp. rate 15, height 5\' 4"  (1.626 m), weight 330 lb 11 oz (150 kg), SpO2 99 %.  Intake/Output Summary (Last 24 hours) at 11/10/14 0835 Last data filed at 11/10/14 0748  Gross per 24 hour  Intake 1750.5 ml  Output   1200 ml  Net  550.5 ml   Limited: Normal sinus rhythm.  Morbidly obese, short statured woman wearing oxygen via face mask. Mildly labored breathing at rest. HEENT: Conjunctiva and lids normal, oropharynx clear. Neck: Supple, difficult to assess JVP with increased girth, no carotid bruits, no thyromegaly. Lungs: Coarse breath sounds with scattered rhonchi but no wheezing, mildly labored breathing at rest. Cardiac: Regular rate and rhythm, no S3, soft systolic murmur, no pericardial rub. Abdomen: Soft, nontender, protuberant, bowel sounds present, no guarding or rebound. Extremities: Chronic appearing edema and lymphedema with erythema below the knees bilaterally and evidence of previous venous surgery/scars., distal pulses 1+. Skin: Warm and dry. Musculoskeletal: No kyphosis. Neuropsychiatric: Alert and oriented x3, affect grossly appropriate.   Lab Results  Basic Metabolic Panel:  Recent Labs Lab 11/09/14 1708 11/10/14 0450  NA 135 132*  K 3.4* 4.6  CL 93* 92*  CO2 35* 32  GLUCOSE 221* 343*  BUN 16 19  CREATININE 0.95 0.85  CALCIUM 8.2* 8.0*    Liver  Function Tests:  Recent Labs Lab 11/10/14 0450  AST 17  ALT 13  ALKPHOS 81  BILITOT 0.6  PROT 6.5  ALBUMIN 3.2*    CBC:  Recent Labs Lab 11/09/14 1708 11/10/14 0450  WBC 9.8 10.0  NEUTROABS 8.1*  --   HGB 13.0 13.5  HCT 42.1 43.6  MCV 85.4 84.8  PLT 237 198    Cardiac Enzymes:  Recent Labs Lab 11/09/14 1708 11/09/14 2334 11/10/14 0450  TROPONINI 0.27* 0.16* 0.20*    Imaging EXAM: PORTABLE CHEST - 1 VIEW  COMPARISON: 06/23/2006  FINDINGS: Shallow inspiration. Cardiac enlargement with mild pulmonary vascular congestion. No edema or consolidation. No blunting of costophrenic angles. No pneumothorax. Calcification of the aorta. Stimulator leads projected over the mid thoracic spine.  IMPRESSION: Cardiac enlargement with mild pulmonary vascular congestion.   Impression  1.  Presentation with shortness of breath, likely multifactorial in the setting of chronic lung disease, probable hypoventilation syndrome, chronic respiratory failure, and diastolic dysfunction.  2. Mildly abnormal troponin I, likely reflective of demand ischemia in the setting of other comorbidities rather than ACS. ECG nonspecific. At this point do not anticipate further ischemic workup.  3. Possible acute on chronic diastolic heart failure. Echocardiogram from March 2015 through the [facility] reported normal LVEF, mild left atrial enlargement, trivial tricuspid regurgitation. No other details available.  4. Morbid obesity with OSA.  5. Uncontrolled type 2 diabetes mellitus with peripheral neuropathy.  6. History of leg cellulitis, currently patient reporting increasing leg erythema, no fevers or chills. She has been placed on vancomycin by the primary team.  7. Lymphedema and known venous reflux.  8. History of normal coronary arteries at cardiac catheterization 2010.   Recommendations  Increase IV Lasix to 80 mg twice daily, continue Aldactone. Depending on intake and output,  can consider whether metolazone needs to be started back now or not. Follow-up echocardiogram will be obtained. Would suggest ABG and continued optimization of pulmonary status as well. Our service will follow with you and assist with management.  Jonelle Sidle, M.D., F.A.C.C.

## 2014-11-10 NOTE — Progress Notes (Signed)
Inpatient Diabetes Program Recommendations  AACE/ADA: New Consensus Statement on Inpatient Glycemic Control (2013)  Target Ranges:  Prepandial:   less than 140 mg/dL      Peak postprandial:   less than 180 mg/dL (1-2 hours)      Critically ill patients:  140 - 180 mg/dL   Results for Jennifer Mejia, Jennifer Mejia (MRN [id number]) as of 11/10/2014 14:29  Ref. Range 11/09/2014 17:08 11/10/2014 04:50  Glucose Latest Range: 70-99 mg/dL 829221 (H) 562343 (H)    Diabetes history: DM2 Outpatient Diabetes medications: Lantus 60 units BID, Novolog 35 units TID with meals, Metformin 1000 mg BID, Januvia 100 mg daily Current orders for Inpatient glycemic control: Lantus 60 units BID, Novolog 0-20 units TID with meals, Novolog 0-5 units HS  Inpatient Diabetes Program Recommendations Insulin - Basal: Please consider increasing Lantus to 65 units BID (if increased, will likely need to titrate back down once steroids are tapered). Correction (SSI): Noted Novolog correction was increased to Resistant scale today and bedtime scale was ordered. Insulin - Meal Coverage: While ordered steroids, please consider ordering Novolog 5 units TID with meals for meal coverage (in addition to Novolog correction scale).  Note: CBGs have not been uploaded in chart from today. Noted progress note by RN that CBG up to 405 mg/dl before lunch today.  Thanks, Orlando PennerMarie Kensey Luepke, RN, MSN, CCRN, CDE Diabetes Coordinator Inpatient Diabetes Program [phone] (Team Pager) [phone] ([place] office) [phone] [facility] office)

## 2014-11-10 NOTE — Care Management Utilization Note (Signed)
UR completed 

## 2014-11-10 NOTE — Progress Notes (Signed)
MD paged about CBG 405, called back and stated to change sliding scale to resistant coverage and give 20 units Novolog now.  md orders received and carried out. Schonewitz, Candelaria StagersLeigh Anne 11/10/2014

## 2014-11-11 DIAGNOSIS — J441 Chronic obstructive pulmonary disease with (acute) exacerbation: Secondary | ICD-10-CM

## 2014-11-11 DIAGNOSIS — I5033 Acute on chronic diastolic (congestive) heart failure: Principal | ICD-10-CM

## 2014-11-11 LAB — CBC
HCT: 43.2 % (ref 36.0–46.0)
Hemoglobin: 13.7 g/dL (ref 12.0–15.0)
MCH: 27 pg (ref 26.0–34.0)
MCHC: 31.7 g/dL (ref 30.0–36.0)
MCV: 85.2 fL (ref 78.0–100.0)
Platelets: 213 10*3/uL (ref 150–400)
RBC: 5.07 MIL/uL (ref 3.87–5.11)
RDW: 16.1 % — ABNORMAL HIGH (ref 11.5–15.5)
WBC: 10 10*3/uL (ref 4.0–10.5)

## 2014-11-11 LAB — BASIC METABOLIC PANEL
Anion gap: 7 (ref 5–15)
BUN: 29 mg/dL — AB (ref 6–23)
CHLORIDE: 89 mmol/L — AB (ref 96–112)
CO2: 35 mmol/L — ABNORMAL HIGH (ref 19–32)
Calcium: 8.1 mg/dL — ABNORMAL LOW (ref 8.4–10.5)
Creatinine, Ser: 0.98 mg/dL (ref 0.50–1.10)
GFR, EST AFRICAN AMERICAN: 70 mL/min — AB (ref >90)
GFR, EST NON AFRICAN AMERICAN: 61 mL/min — AB (ref >90)
GLUCOSE: 404 mg/dL — AB (ref 70–99)
POTASSIUM: 4.8 mmol/L (ref 3.5–5.1)
Sodium: 131 mmol/L — ABNORMAL LOW (ref 135–145)

## 2014-11-11 LAB — URINE CULTURE
Colony Count: NO GROWTH
Culture: NO GROWTH

## 2014-11-11 LAB — HEMOGLOBIN A1C
HEMOGLOBIN A1C: 8.2 % — AB (ref 4.8–5.6)
Mean Plasma Glucose: 189 mg/dL

## 2014-11-11 LAB — GLUCOSE, CAPILLARY: Glucose-Capillary: 485 mg/dL — ABNORMAL HIGH (ref 70–99)

## 2014-11-11 MED ORDER — INSULIN GLARGINE 100 UNIT/ML ~~LOC~~ SOLN
8.0000 [IU] | Freq: Once | SUBCUTANEOUS | Status: AC
  Administered 2014-11-11: 8 [IU] via SUBCUTANEOUS
  Filled 2014-11-11: qty 0.08

## 2014-11-11 MED ORDER — GUAIFENESIN ER 600 MG PO TB12
1200.0000 mg | ORAL_TABLET | Freq: Two times a day (BID) | ORAL | Status: DC
  Administered 2014-11-11 – 2014-11-14 (×7): 1200 mg via ORAL
  Filled 2014-11-11 (×7): qty 2

## 2014-11-11 MED ORDER — INSULIN GLARGINE 100 UNIT/ML ~~LOC~~ SOLN
72.0000 [IU] | Freq: Two times a day (BID) | SUBCUTANEOUS | Status: DC
  Administered 2014-11-11 – 2014-11-12 (×2): 72 [IU] via SUBCUTANEOUS
  Filled 2014-11-11 (×6): qty 0.72

## 2014-11-11 MED ORDER — RACEPINEPHRINE HCL 2.25 % IN NEBU: 0.5000 mL | INHALATION_SOLUTION | RESPIRATORY_TRACT | Status: DC | PRN

## 2014-11-11 MED ORDER — CETYLPYRIDINIUM CHLORIDE 0.05 % MT LIQD
7.0000 mL | Freq: Two times a day (BID) | OROMUCOSAL | Status: DC
  Administered 2014-11-11 – 2014-11-14 (×7): 7 mL via OROMUCOSAL

## 2014-11-11 MED ORDER — INSULIN ASPART 100 UNIT/ML ~~LOC~~ SOLN
10.0000 [IU] | Freq: Once | SUBCUTANEOUS | Status: AC
  Administered 2014-11-11: 10 [IU] via SUBCUTANEOUS

## 2014-11-11 MED ORDER — INSULIN ASPART 100 UNIT/ML ~~LOC~~ SOLN
10.0000 [IU] | Freq: Three times a day (TID) | SUBCUTANEOUS | Status: DC
  Administered 2014-11-11 – 2014-11-14 (×10): 10 [IU] via SUBCUTANEOUS

## 2014-11-11 NOTE — Progress Notes (Signed)
Inpatient Diabetes Program Recommendations  AACE/ADA: New Consensus Statement on Inpatient Glycemic Control (2013)  Target Ranges:  Prepandial:   less than 140 mg/dL      Peak postprandial:   less than 180 mg/dL (1-2 hours)      Critically ill patients:  140 - 180 mg/dL  Results for Burman BlacksmithORDQUIST, Petrina (MRN [id number]) as of 11/11/2014 09:23  Ref. Range 11/11/2014 05:13  Glucose Latest Range: 70-99 mg/dL 409404 (H)   Results for Burman BlacksmithORDQUIST, Maddelynn (MRN [id number]) as of 11/11/2014 09:23  Ref. Range 11/09/2014 23:52 11/10/2014 07:17 11/10/2014 11:25 11/10/2014 16:05 11/10/2014 21:19  Glucose-Capillary Latest Range: 70-99 mg/dL 956334 (H) 213329 (H) 086405 (H) 405 (H) 485 (H)  Results for Burman BlacksmithORDQUIST, Atiana (MRN [id number]) as of 11/11/2014 09:23  Ref. Range 11/09/2014 19:23  Hemoglobin A1C Latest Range: 4.8-5.6 % 8.2 (H)   Diabetes history: DM2 Outpatient Diabetes medications: Lantus 60 units BID, Novolog 35 units TID with meals, Metformin 1000 mg BID, Januvia 100 mg daily Current orders for Inpatient glycemic control: Lantus 65 units BID, Novolog 0-20 units TID with meals, Novolog 0-5 units HS, Novolog 6 units TID with meals for meal coverage   Inpatient Diabetes Program Recommendations Insulin - Basal: Please consider increasing Lantus to 70 units BID. If increased as recommended, please also order a one time Lantus 5 units to be given this am since patient has already received Lantus 65 units this morning. Insulin - Meal Coverage: Please consider increasing meal coverage to Novolog 20 units TID with meals.  Thanks, Orlando PennerMarie Miela Desjardin, RN, MSN, CCRN, CDE Diabetes Coordinator Inpatient Diabetes Program [phone] (Team Pager) [phone] ([place] office) [phone] [facility] office)

## 2014-11-11 NOTE — H&P (Signed)
Consult requested by:Dr. Ardyth Harps Consult requested for respiratory failure:  HPI: This is a 64 year old with a long known history of COPD CHF diabetes hypertension and thyroid disease. She says she's been sick for about for 5 days prior to coming to the hospital. She felt like she was having more cough and congestion more shortness of breath. She had not taken her Lasix for 2 or 3 days prior to coming because she says when she is short of breath she can't get to the bathroom in time if she takes her Lasix. Since she's been in the hospital she's been treated for CHF and COPD and although she has improved she is still having trouble with hypoxia. Her oxygen saturation tends to drop despite supplemental O2.  Past Medical History  Diagnosis Date  . COPD (chronic obstructive pulmonary disease)   . Type 2 diabetes mellitus with peripheral neuropathy   . Essential hypertension   . Anxiety   . Hypothyroidism   . Chronic diastolic heart failure     Dr. Launa Flight ([facility])  . History of cardiac catheterization 2010    Normal coronaries ([facility])  . OSA (obstructive sleep apnea)   . Chronic venous insufficiency   . Obesity   . History of cellulitis   . Lymphedema   . Chronic pain   . GERD (gastroesophageal reflux disease)      Family History  Problem Relation Age of Onset  . CAD Mother   . Diabetes Mellitus II Sister   . Diabetes Mellitus II Mother   . Hypertension Father   . Liver disease Mother      History   Social History  . Marital Status: Single    Spouse Name: N/A  . Number of Children: N/A  . Years of Education: N/A   Social History Main Topics  . Smoking status: Current Every Day Smoker    Types: Cigarettes  . Smokeless tobacco: Not on file  . Alcohol Use: No  . Drug Use: No  . Sexual Activity: Not on file   Other Topics Concern  . None   Social History Narrative     ROS: She denies any chest pain. She's had a lot of swelling of her legs and says  she's had cellulitis. She's coughing up sputum. No fever at home. The rest per the history and physical   Objective: Vital signs in last 24 hours: Temp:  [97.8 F (36.6 C)-98.4 F (36.9 C)] 97.8 F (36.6 C) (03/11 0500) Pulse Rate:  [38-68] 56 (03/11 0700) Resp:  [8-17] 11 (03/11 0700) BP: (99-134)/(48-59) 125/53 mmHg (03/11 0700) SpO2:  [86 %-100 %] 86 % (03/11 0700) FiO2 (%):  [55 %-100 %] 55 % (03/11 0500) Weight:  [152.5 kg (336 lb 3.2 oz)] 152.5 kg (336 lb 3.2 oz) (03/11 0500) Weight change: 7.802 kg (17 lb 3.2 oz) Last BM Date: 11/09/14  Intake/Output from previous day: 03/10 0701 - 03/11 0700 In: 580 [P.O.:240; I.V.:140; IV Piggyback:200] Out: 3700 [Urine:3700]  PHYSICAL EXAM She is awake and alert. She has proptosis of her right eye. Her pupils react her nose and throat are clear she is obese. Her neck is supple without masses. Her chest shows rhonchi and wheezes bilaterally. Her heart is regular without gallop. Her abdomen is soft obese without masses. She still has edema of both legs and what appears to be cellulitis of both legs.  Lab Results: Basic Metabolic Panel:  Recent Labs  16/10/96 2200 11/11/14 0513  NA 129* 131*  K 5.3* 4.8  CL 90* 89*  CO2 33* 35*  GLUCOSE 459* 404*  BUN 27* 29*  CREATININE 1.25* 0.98  CALCIUM 7.9* 8.1*   Liver Function Tests:  Recent Labs  11/10/14 0450  AST 17  ALT 13  ALKPHOS 81  BILITOT 0.6  PROT 6.5  ALBUMIN 3.2*   No results for input(s): LIPASE, AMYLASE in the last 72 hours. No results for input(s): AMMONIA in the last 72 hours. CBC:  Recent Labs  11/09/14 1708 11/10/14 0450 11/11/14 0513  WBC 9.8 10.0 10.0  NEUTROABS 8.1*  --   --   HGB 13.0 13.5 13.7  HCT 42.1 43.6 43.2  MCV 85.4 84.8 85.2  PLT 237 198 213   Cardiac Enzymes:  Recent Labs  11/09/14 2334 11/10/14 0450 11/10/14 1250  TROPONINI 0.16* 0.20* 0.10*   BNP: No results for input(s): PROBNP in the last 72 hours. D-Dimer: No results  for input(s): DDIMER in the last 72 hours. CBG:  Recent Labs  11/09/14 2352 11/10/14 0717 11/10/14 1125 11/10/14 1605 11/10/14 2119  GLUCAP 334* 329* 405* 405* 485*   Hemoglobin A1C:  Recent Labs  11/09/14 1923  HGBA1C 8.2*   Fasting Lipid Panel: No results for input(s): CHOL, HDL, LDLCALC, TRIG, CHOLHDL, LDLDIRECT in the last 72 hours. Thyroid Function Tests: No results for input(s): TSH, T4TOTAL, FREET4, T3FREE, THYROIDAB in the last 72 hours. Anemia Panel: No results for input(s): VITAMINB12, FOLATE, FERRITIN, TIBC, IRON, RETICCTPCT in the last 72 hours. Coagulation: No results for input(s): LABPROT, INR in the last 72 hours. Urine Drug Screen: Drugs of Abuse  No results found for: LABOPIA, COCAINSCRNUR, LABBENZ, AMPHETMU, THCU, LABBARB  Alcohol Level: No results for input(s): ETH in the last 72 hours. Urinalysis:  Recent Labs  11/09/14 1752  COLORURINE YELLOW  LABSPEC >1.030*  PHURINE 6.0  GLUCOSEU NEGATIVE  HGBUR NEGATIVE  BILIRUBINUR NEGATIVE  KETONESUR NEGATIVE  PROTEINUR NEGATIVE  UROBILINOGEN 0.2  NITRITE NEGATIVE  LEUKOCYTESUR NEGATIVE   Misc. Labs:   ABGS: No results for input(s): PHART, PO2ART, TCO2, HCO3 in the last 72 hours.  Invalid input(s): PCO2   MICROBIOLOGY: Recent Results (from the past 240 hour(s))  Culture, blood (routine x 2)     Status: None (Preliminary result)   Collection Time: 11/09/14  5:13 PM  Result Value Ref Range Status   Specimen Description BLOOD RIGHT ANTECUBITAL  Final   Special Requests BOTTLES DRAWN AEROBIC AND ANAEROBIC 8CC  Final   Culture NO GROWTH 1 DAY  Final   Report Status PENDING  Incomplete  Culture, blood (routine x 2)     Status: None (Preliminary result)   Collection Time: 11/09/14  5:20 PM  Result Value Ref Range Status   Specimen Description BLOOD LEFT HAND  Final   Special Requests BOTTLES DRAWN AEROBIC AND ANAEROBIC 6CC  Final   Culture NO GROWTH 1 DAY  Final   Report Status PENDING   Incomplete  Urine culture     Status: None   Collection Time: 11/09/14  5:52 PM  Result Value Ref Range Status   Specimen Description URINE, CATHETERIZED  Final   Special Requests NONE  Final   Colony Count NO GROWTH Performed at [facility]   Final   Culture NO GROWTH Performed at [facility]   Final   Report Status 11/11/2014 FINAL  Final  MRSA PCR Screening     Status: None   Collection Time: 11/09/14 11:30 PM  Result Value Ref Range Status  MRSA by PCR NEGATIVE NEGATIVE Final    Comment:        The GeneXpert MRSA Assay (FDA approved for NASAL specimens only), is one component of a comprehensive MRSA colonization surveillance program. It is not intended to diagnose MRSA infection nor to guide or monitor treatment for MRSA infections.     Studies/Results: Dg Chest Port 1 View  11/09/2014   CLINICAL DATA:  Constant moderate shortness of Breath starting 2 days ago. Baseline leg swelling worsening today. Bilateral lower extremity erythema. Diaphoresis and productive cough.  EXAM: PORTABLE CHEST - 1 VIEW  COMPARISON:  06/23/2006  FINDINGS: Shallow inspiration. Cardiac enlargement with mild pulmonary vascular congestion. No edema or consolidation. No blunting of costophrenic angles. No pneumothorax. Calcification of the aorta. Stimulator leads projected over the mid thoracic spine.  IMPRESSION: Cardiac enlargement with mild pulmonary vascular congestion.   Electronically Signed   By: Burman NievesWilliam  Stevens M.D.   On: 11/09/2014 17:40    Medications:  Prior to Admission:  Prescriptions prior to admission  Medication Sig Dispense Refill Last Dose  . albuterol (PROVENTIL HFA;VENTOLIN HFA) 108 (90 BASE) MCG/ACT inhaler Inhale 1-2 puffs into the lungs every 6 (six) hours as needed for wheezing or shortness of breath.   11/09/2014 at Unknown time  . ARIPiprazole (ABILIFY) 10 MG tablet Take 10 mg by mouth daily.   11/09/2014 at Unknown time  . atorvastatin (LIPITOR) 80 MG  tablet Take 80 mg by mouth at bedtime.   11/08/2014 at Unknown time  . budesonide-formoterol (SYMBICORT) 160-4.5 MCG/ACT inhaler Inhale 2 puffs into the lungs 2 (two) times daily.   11/09/2014 at Unknown time  . carisoprodol (SOMA) 350 MG tablet Take 350 mg by mouth 4 (four) times daily as needed for muscle spasms.   11/09/2014 at Unknown time  . Cholecalciferol 1000 UNITS tablet Take 1,000 Units by mouth daily.   11/09/2014 at Unknown time  . clonazePAM (KLONOPIN) 2 MG tablet Take 2 mg by mouth at bedtime.   11/08/2014 at Unknown time  . diltiazem (CARDIZEM CD) 120 MG 24 hr capsule Take 120 mg by mouth daily.   11/09/2014 at Unknown time  . docusate sodium (COLACE) 100 MG capsule Take 100 mg by mouth 2 (two) times daily.   11/09/2014 at Unknown time  . DULoxetine (CYMBALTA) 60 MG capsule Take 60 mg by mouth daily.   11/09/2014 at Unknown time  . esomeprazole (NEXIUM) 40 MG capsule Take 40 mg by mouth 2 (two) times daily before a meal.   11/09/2014 at Unknown time  . furosemide (LASIX) 80 MG tablet Take 80 mg by mouth 2 (two) times daily.   11/09/2014 at Unknown time  . gabapentin (NEURONTIN) 800 MG tablet Take 800-1,600 mg by mouth 4 (four) times daily. Patient takes 1tab@6am ,1tab@10am ,1tab@2pm ,1tab@6pm ,2tab@@10pm    11/09/2014 at Unknown time  . Insulin Glargine 300 UNIT/ML SOPN Inject 120 Units into the skin 2 (two) times daily.   11/09/2014 at Unknown time  . levocetirizine (XYZAL) 5 MG tablet Take 5 mg by mouth every evening.   11/08/2014 at Unknown time  . levothyroxine (SYNTHROID, LEVOTHROID) 50 MCG tablet Take 50 mcg by mouth daily.   11/09/2014 at Unknown time  . Magnesium 500 MG TABS Take 500 mg by mouth daily.   11/09/2014 at Unknown time  . metFORMIN (GLUCOPHAGE) 1000 MG tablet Take 1,000 mg by mouth 2 (two) times daily with a meal.   11/09/2014 at Unknown time  . metolazone (ZAROXOLYN) 2.5 MG tablet Take 2.5 mg by mouth  3 (three) times a week.   11/08/2014 at Unknown time  . mometasone (NASONEX) 50 MCG/ACT nasal spray  Place 2 sprays into the nose daily.   11/09/2014 at Unknown time  . montelukast (SINGULAIR) 10 MG tablet Take 10 mg by mouth at bedtime.   11/08/2014 at Unknown time  . NOVOLOG FLEXPEN 100 UNIT/ML FlexPen Inject 35 Units into the skin 3 (three) times daily before meals.    11/09/2014 at Unknown time  . Olopatadine HCl 0.2 % SOLN Place 1 drop into both eyes daily as needed (itching).   unknown  . oxyCODONE (ROXICODONE) 15 MG immediate release tablet Take 15 mg by mouth 4 (four) times daily.   11/09/2014 at Unknown time  . Oxycodone HCl 10 MG TABS Take 10 mg by mouth 4 (four) times daily.   11/09/2014 at Unknown time  . OxyCODONE HCl ER 60 MG T12A Take 60 mg by mouth 3 (three) times daily.   11/09/2014 at Unknown time  . potassium chloride SA (K-DUR,KLOR-CON) 20 MEQ tablet Take 20 mEq by mouth daily.   11/09/2014 at Unknown time  . prednisoLONE acetate (PRED FORTE) 1 % ophthalmic suspension Place 1 drop into both eyes 4 (four) times daily.   11/09/2014 at Unknown time  . primidone (MYSOLINE) 50 MG tablet Take 50 mg by mouth 2 (two) times daily.   11/09/2014 at Unknown time  . ramipril (ALTACE) 10 MG capsule Take 10 mg by mouth daily.   11/09/2014 at Unknown time  . ranitidine (ZANTAC) 150 MG capsule Take 150 mg by mouth 2 (two) times daily.   11/09/2014 at Unknown time  . senna-docusate (SENOKOT-S) 8.6-50 MG per tablet Take 2 tablets by mouth every 12 (twelve) hours.   11/09/2014 at Unknown time  . sitaGLIPtin (JANUVIA) 100 MG tablet Take 100 mg by mouth daily.   11/09/2014 at Unknown time  . spironolactone (ALDACTONE) 50 MG tablet Take 50 mg by mouth daily.   11/09/2014 at Unknown time  . tiotropium (SPIRIVA) 18 MCG inhalation capsule Place 18 mcg into inhaler and inhale daily.   11/09/2014 at Unknown time  . zolpidem (AMBIEN CR) 12.5 MG CR tablet Take 12.5 mg by mouth at bedtime.   11/08/2014 at Unknown time   Scheduled: . antiseptic oral rinse  7 mL Mouth Rinse BID  . ARIPiprazole  10 mg Oral Daily  . atorvastatin  80 mg Oral  QHS  . budesonide-formoterol  2 puff Inhalation BID  . clonazePAM  2 mg Oral QHS  . diltiazem  120 mg Oral Daily  . docusate sodium  100 mg Oral BID  . DULoxetine  60 mg Oral Daily  . enoxaparin (LOVENOX) injection  75 mg Subcutaneous Q24H  . fluticasone  2 spray Each Nare Daily  . furosemide  80 mg Intravenous Q12H  . gabapentin  1,600 mg Oral QHS  . gabapentin  800 mg Oral 4 times per day  . guaiFENesin  1,200 mg Oral BID  . insulin aspart  0-20 Units Subcutaneous TID WC  . insulin aspart  0-5 Units Subcutaneous QHS  . insulin aspart  6 Units Subcutaneous TID WC  . insulin glargine  65 Units Subcutaneous BID  . ipratropium-albuterol  3 mL Nebulization Q6H  . levothyroxine  50 mcg Oral QAC breakfast  . loratadine  10 mg Oral QPC supper  . methylPREDNISolone (SOLU-MEDROL) injection  80 mg Intravenous Q6H  . montelukast  10 mg Oral QHS  . OxyCODONE  60 mg Oral TID  .  pantoprazole  40 mg Oral BID  . primidone  50 mg Oral BID  . ramipril  10 mg Oral Daily  . senna-docusate  2 tablet Oral Q12H  . sodium chloride  3 mL Intravenous Q12H  . spironolactone  50 mg Oral Daily  . vancomycin  1,000 mg Intravenous Q8H   Continuous:  WJX:BJYNWG chloride, acetaminophen **OR** acetaminophen, carisoprodol, HYDROmorphone (DILAUDID) injection, ondansetron **OR** ondansetron (ZOFRAN) IV, oxyCODONE, Racepinephrine HCl, sodium chloride, zolpidem  Assesment: She has hypoxic respiratory failure on the basis of COPD exacerbation and acute on chronic CHF. She also has obstructive sleep apnea and I think some element of obesity hypoventilation. She is on appropriate treatment for her COPD and I would not change that. I will ask her to do incentive spirometry and a flutter valve and guaifenesin and start her back on C Pap that she takes at night. Principal Problem:   COPD exacerbation Active Problems:   Acute on chronic diastolic CHF (congestive heart failure)   Diabetes mellitus   Cellulitis    Plan:  As above. I will be out of town for the next 2 days. I will return on the 14th.    LOS: 2 days   Felicie Kocher L 11/11/2014, 9:22 AM

## 2014-11-11 NOTE — Care Management Note (Addendum)
    Page 1 of 2   11/14/2014     2:01:19 PM CARE MANAGEMENT NOTE 11/14/2014  [facility]   Account Number:  [id number]  Date Initiated:  11/11/2014  Documentation initiated by:  Kathyrn SheriffHILDRESS,JESSICA  Subjective/Objective Assessment:   Pt is from home, lives alone. [facility]. Pt has home O2, neb machine and CPAP through [facility].  Pt has a cane, walker, wheelchair and BSC at home.     Action/Plan:   Pt plans to dsicharge home with resumption of aid services and PT. [facility], Patria Manedweena Swann [facility] notofied of referral and will obtain pt information from chart. No further CM needs identified.   Anticipated DC Date:  11/13/2014   Anticipated DC Plan:  HOME W HOME HEALTH SERVICES      DC Planning Services  CM consult      [facility]  [facility]   Choice offered to / List presented to:  C-1 Patient        HH arranged  HH-2 PT  HH-1 RN      [facility]   Status of service:  Completed, signed off Medicare Important Message given?  YES (If response is "NO", the following Medicare IM given date fields will be blank) Date Medicare IM given:  11/11/2014 Medicare IM given by:  Kathyrn SheriffHILDRESS,JESSICA Date Additional Medicare IM given:  11/14/2014 Additional Medicare IM given by:  Sharrie RothmanAMMY C Belle [place]  [facility]  Per UR Regulation:  Reviewed for med. necessity/level of care/duration of stay  If discussed at Long Length of Stay Meetings, dates discussed:    Comments:  11/14/14 1355 Arlyss Queenammy Daruis Swaim, RN BSN CM Pt discharged home today with resumption of [facility] HH Rn andPT (per pts choice). Dareen Pianodwinna of [facility] is aware and collected pts information from the chart. HH services to resume within 48 hours of discharge. No DME needs noted. Pt and pts nurse aware of discharge arrangements.  11/11/2014 1100 Kathyrn SheriffJessica Childress, RN, MSN, CM

## 2014-11-11 NOTE — Progress Notes (Signed)
Asked Patient if she wore CPAP at home her statement;" was when she wanted too".Will ask her to wear tonight but not sure how much she will do as she appears to be non compliant.

## 2014-11-11 NOTE — Progress Notes (Signed)
[facility] PROGRESS NOTE  Jennifer Mejia ZOX:[id number] DOB: 1951-03-08 DOA: 11/09/2014 PCP: CRIM, [place] DAVID, MD  Assessment/Plan: Acute on Chronic Hypoxemic Respiratory Failure -2/2 COPD, CHF as well as OSA/OHS (see below for details). -Was able to titrate to [place] this am. -Appreciate pulmonary input and recommendations.  COPD with Acute Exacerbation -Seems clinically improved. -Less wheezing, altho still present. -Continue steroids at current dose/nebs. -IS and flutter valve ordered by pulmonary.  OSA -CPAP qHs.  Acute on Chronic Diastolic CHF -Continue lasix at 80 mg IV BID. -Is 1.3 L negative since admission. -ECHO: EF 65-70%, LVH, component of right heart failure c/w her pulmonary issues.  Demand Ischemia -Troponins stable around 0.20. -Likely related to ongoing pulmonary issues. -no cardiac work up planned for at present. -ECHO as above. -Appreciate cardiology assistance.  ?bilateral LE Cellulitis -not convinced this is cellulitis. -looks more like venous insufficiency to me. -Will DC vancomycin today.  Morbid Obesity  DM -CBGs elevated. -Will continue to increase lantus and meal coverage.  Code Status: Full Code Family Communication: patient only Disposition Plan: Keep in ICU today.   Consultants:  Cardiology  Pulmonary   Antibiotics:  None  Subjective: SOB improved from yesterday. Still c/o audible wheezing.  Objective: Filed Vitals:   11/11/14 0800 11/11/14 0900 11/11/14 0942 11/11/14 1000  BP: 135/54 127/61  113/48  Pulse: 59 72  74  Temp:      TempSrc:      Resp: Height:      Weight:      SpO2: 81% 96% 92% 92%    Intake/Output Summary (Last 24 hours) at 11/11/14 1028 Last data filed at 11/11/14 0910  Gross per 24 hour  Intake 1071.67 ml  Output   3150 ml  Net -2078.33 ml   Filed Weights   11/09/14 2330 11/10/14 0500 11/11/14 0500  Weight: 150 kg (330 lb 11 oz) 150 kg (330 lb 11 oz) 152.5 kg (336 lb  3.2 oz)    Exam:   General:  AA Ox3  Cardiovascular: RRR  Respiratory: Diffuse expiratory wheezing  Abdomen: S/NT/ND/+BS  Extremities: 2-3++ edema, redness over bilateral lower extremities from ankles to right below knees.   Neurologic:  Non-focal  Data Reviewed: Basic Metabolic Panel:  Recent Labs Lab 11/09/14 1708 11/10/14 0450 11/10/14 2200 11/11/14 0513  NA 135 132* 129* 131*  K 3.4* 4.6 5.3* 4.8  CL 93* 92* 90* 89*  CO2 35* 32 33* 35*  GLUCOSE 221* 343* 459* 404*  BUN 16 19 27* 29*  CREATININE 0.95 0.85 1.25* 0.98  CALCIUM 8.2* 8.0* 7.9* 8.1*   Liver Function Tests:  Recent Labs Lab 11/10/14 0450  AST 17  ALT 13  ALKPHOS 81  BILITOT 0.6  PROT 6.5  ALBUMIN 3.2*   No results for input(s): LIPASE, AMYLASE in the last 168 hours. No results for input(s): AMMONIA in the last 168 hours. CBC:  Recent Labs Lab 11/09/14 1708 11/10/14 0450 11/11/14 0513  WBC 9.8 10.0 10.0  NEUTROABS 8.1*  --   --   HGB 13.0 13.5 13.7  HCT 42.1 43.6 43.2  MCV 85.4 84.8 85.2  PLT 237 198 213   Cardiac Enzymes:  Recent Labs Lab 11/09/14 1708 11/09/14 2334 11/10/14 0450 11/10/14 1250  TROPONINI 0.27* 0.16* 0.20* 0.10*   BNP (last 3 results)  Recent Labs  11/09/14 1708  BNP 105.0*    ProBNP (last 3 results) No results for input(s): PROBNP in the last 8760 hours.  CBG:  Recent Labs Lab 11/09/14 2352 11/10/14 0717 11/10/14 1125 11/10/14 1605 11/10/14 2119  GLUCAP 334* 329* 405* 405* 485*    Recent Results (from the past 240 hour(s))  Culture, blood (routine x 2)     Status: None (Preliminary result)   Collection Time: 11/09/14  5:13 PM  Result Value Ref Range Status   Specimen Description BLOOD RIGHT ANTECUBITAL  Final   Special Requests BOTTLES DRAWN AEROBIC AND ANAEROBIC 8CC  Final   Culture NO GROWTH 1 DAY  Final   Report Status PENDING  Incomplete  Culture, blood (routine x 2)     Status: None (Preliminary result)   Collection Time:  11/09/14  5:20 PM  Result Value Ref Range Status   Specimen Description BLOOD LEFT HAND  Final   Special Requests BOTTLES DRAWN AEROBIC AND ANAEROBIC 6CC  Final   Culture NO GROWTH 1 DAY  Final   Report Status PENDING  Incomplete  Urine culture     Status: None   Collection Time: 11/09/14  5:52 PM  Result Value Ref Range Status   Specimen Description URINE, CATHETERIZED  Final   Special Requests NONE  Final   Colony Count NO GROWTH [facility]formed [facility]   Final   Culture NO GROWTH [facility]formed at [facility]   Final   Report Status 11/11/2014 FINAL  Final  MRSA PCR Screening     Status: None   Collection Time: 11/09/14 11:30 PM  Result Value Ref Range Status   MRSA by PCR NEGATIVE NEGATIVE Final    Comment:        The GeneXpert MRSA Assay (FDA approved for NASAL specimens only), is one component of a comprehensive MRSA colonization surveillance program. It is not intended to diagnose MRSA infection nor to guide or monitor treatment for MRSA infections.      Studies: Dg Chest Port 1 View  11/09/2014   CLINICAL DATA:  Constant moderate shortness of Breath starting 2 days ago. Baseline leg swelling worsening today. Bilateral lower extremity erythema. Diaphoresis and productive cough.  EXAM: PORTABLE CHEST - 1 VIEW  COMPARISON:  06/23/2006  FINDINGS: Shallow inspiration. Cardiac enlargement with mild pulmonary vascular congestion. No edema or consolidation. No blunting of costophrenic angles. No pneumothorax. Calcification of the aorta. Stimulator leads projected over the mid thoracic spine.  IMPRESSION: Cardiac enlargement with mild pulmonary vascular congestion.   Electronically Signed   By: Burman NievesWilliam  Stevens M.D.   On: 11/09/2014 17:40    Scheduled Meds: . antiseptic oral rinse  7 mL Mouth Rinse BID  . ARIPiprazole  10 mg Oral Daily  . atorvastatin  80 mg Oral QHS  . budesonide-formoterol  2 puff Inhalation BID  . clonazePAM  2 mg Oral QHS  . diltiazem   120 mg Oral Daily  . docusate sodium  100 mg Oral BID  . DULoxetine  60 mg Oral Daily  . enoxaparin (LOVENOX) injection  75 mg Subcutaneous Q24H  . fluticasone  2 spray Each Nare Daily  . furosemide  80 mg Intravenous Q12H  . gabapentin  1,600 mg Oral QHS  . gabapentin  800 mg Oral 4 times per day  . guaiFENesin  1,200 mg Oral BID  . insulin aspart  0-20 Units Subcutaneous TID WC  . insulin aspart  0-5 Units Subcutaneous QHS  . insulin aspart  10 Units Subcutaneous TID WC  . insulin glargine  72 Units Subcutaneous BID  . insulin glargine  8 Units Subcutaneous Once  .  ipratropium-albuterol  3 mL Nebulization Q6H  . levothyroxine  50 mcg Oral QAC breakfast  . loratadine  10 mg Oral QPC supper  . methylPREDNISolone (SOLU-MEDROL) injection  80 mg Intravenous Q6H  . montelukast  10 mg Oral QHS  . OxyCODONE  60 mg Oral TID  . pantoprazole  40 mg Oral BID  . primidone  50 mg Oral BID  . ramipril  10 mg Oral Daily  . senna-docusate  2 tablet Oral Q12H  . sodium chloride  3 mL Intravenous Q12H  . spironolactone  50 mg Oral Daily  . vancomycin  1,000 mg Intravenous Q8H   Continuous Infusions:   Principal Problem:   COPD exacerbation Active Problems:   Acute on chronic diastolic CHF (congestive heart failure)   Diabetes mellitus   Cellulitis    Time spent: 25 minutes. Greater than 50% of this time was spent in direct contact with the patient coordinating care.    Chaya Jan  [facility] Pager [phone]  If 7PM-7AM, please contact night-coverage at [email or web address], password [facility] 11/11/2014, 10:28 AM  LOS: 2 days

## 2014-11-11 NOTE — Progress Notes (Signed)
Pt's CBG has been high today. MD is aware, she updated meal coverage and lantus. Will continue to monitor.

## 2014-11-11 NOTE — Progress Notes (Signed)
Primary Cardiologist: Launa Flight MD [facility])  Cardiology Specific Problem List: 1. Abnormal Troponin - Demand Ischemia  2. Diastolic CHF  Subjective:    Still having trouble breathing, but " a little better" Legs are sore.  Objective:   Temp:  [97.8 F (36.6 C)-98.4 F (36.9 C)] 97.8 F (36.6 C) (03/11 0500) Pulse Rate:  [38-68] 56 (03/11 0700) Resp:  [8-17] 11 (03/11 0700) BP: (99-137)/(48-100) 125/53 mmHg (03/11 0700) SpO2:  [86 %-100 %] 86 % (03/11 0700) FiO2 (%):  [55 %-100 %] 55 % (03/11 0500) Weight:  [336 lb 3.2 oz (152.5 kg)] 336 lb 3.2 oz (152.5 kg) (03/11 0500)    Filed Weights   11/09/14 2330 11/10/14 0500 11/11/14 0500  Weight: 330 lb 11 oz (150 kg) 330 lb 11 oz (150 kg) 336 lb 3.2 oz (152.5 kg)    Intake/Output Summary (Last 24 hours) at 11/11/14 0800 Last data filed at 11/11/14 0500  Gross per 24 hour  Intake    560 ml  Output   3150 ml  Net  -2590 ml    Telemetry: NSR/ST  Exam:  General: Obese, no acute distress.  Lungs: Diminished sounds in the bases, with significant expiratory wheezes.   Cardiac: No elevated JVP or bruits. RRR distant heart sounds, obscured by breath sounds, no gallop or rub.   Abdomen: Normoactive bowel sounds, nontender, mild distention   Extremities: No pitting edema, distal pulses full. Legs sore to touch.   Neuropsychiatric: Alert and oriented x3, affect appropriate.   Echocardiogram 11/10/2014 Left ventricle: The cavity size was normal. Wall thickness was increased in a pattern of mild LVH. Systolic function was vigorous. The estimated ejection fraction was in the range of 65% to 70%. Wall motion was normal; there were no regional wall motion abnormalities. - Aortic valve: Poorly visualized. Mildly calcified annulus. Probably trileaflet. There was no stenosis. Mean gradient (S): 11 mm Hg. Valve area (VTI): 2.06 cm^2. - Mitral valve: Calcified annulus. - Left atrium: The atrium was at the upper  limits of normal in size. - Right ventricle: The cavity size was moderately dilated. Systolic function was mildly reduced. - Right atrium: The atrium was moderately dilated. - Tricuspid valve: There was trivial regurgitation. - Pulmonary arteries: Systolic pressure could not be accurately estimated. - Inferior vena cava: Not visualized. Unable to estimate CVP. - Pericardium, extracardiac: There was no pericardial effusion.   Lab Results:  Basic Metabolic Panel:  Recent Labs Lab 11/10/14 0450 11/10/14 2200 11/11/14 0513  NA 132* 129* 131*  K 4.6 5.3* 4.8  CL 92* 90* 89*  CO2 32 33* 35*  GLUCOSE 343* 459* 404*  BUN 19 27* 29*  CREATININE 0.85 1.25* 0.98  CALCIUM 8.0* 7.9* 8.1*    CBC:  Recent Labs Lab 11/09/14 1708 11/10/14 0450 11/11/14 0513  WBC 9.8 10.0 10.0  HGB 13.0 13.5 13.7  HCT 42.1 43.6 43.2  MCV 85.4 84.8 85.2  PLT 237 198 213    Cardiac Enzymes:  Recent Labs Lab 11/09/14 2334 11/10/14 0450 11/10/14 1250  TROPONINI 0.16* 0.20* 0.10*   Radiology: Dg Chest Port 1 View  11/09/2014   CLINICAL DATA:  Constant moderate shortness of Breath starting 2 days ago. Baseline leg swelling worsening today. Bilateral lower extremity erythema. Diaphoresis and productive cough.  EXAM: PORTABLE CHEST - 1 VIEW  COMPARISON:  06/23/2006  FINDINGS: Shallow inspiration. Cardiac enlargement with mild pulmonary vascular congestion. No edema or consolidation. No blunting of costophrenic angles. No pneumothorax. Calcification of  the aorta. Stimulator leads projected over the mid thoracic spine.  IMPRESSION: Cardiac enlargement with mild pulmonary vascular congestion.   Electronically Signed   By: Burman NievesWilliam  Stevens M.D.   On: 11/09/2014 17:40     Medications:   Scheduled Medications: . ARIPiprazole  10 mg Oral Daily  . atorvastatin  80 mg Oral QHS  . budesonide-formoterol  2 puff Inhalation BID  . clonazePAM  2 mg Oral QHS  . diltiazem  120 mg Oral Daily  . docusate  sodium  100 mg Oral BID  . DULoxetine  60 mg Oral Daily  . enoxaparin (LOVENOX) injection  75 mg Subcutaneous Q24H  . fluticasone  2 spray Each Nare Daily  . furosemide  80 mg Intravenous Q12H  . gabapentin  1,600 mg Oral QHS  . gabapentin  800 mg Oral 4 times per day  . insulin aspart  0-20 Units Subcutaneous TID WC  . insulin aspart  0-5 Units Subcutaneous QHS  . insulin aspart  6 Units Subcutaneous TID WC  . insulin glargine  65 Units Subcutaneous BID  . ipratropium-albuterol  3 mL Nebulization Q6H  . levothyroxine  50 mcg Oral QAC breakfast  . loratadine  10 mg Oral QPC supper  . methylPREDNISolone (SOLU-MEDROL) injection  80 mg Intravenous Q6H  . montelukast  10 mg Oral QHS  . OxyCODONE  60 mg Oral TID  . pantoprazole  40 mg Oral BID  . primidone  50 mg Oral BID  . ramipril  10 mg Oral Daily  . senna-docusate  2 tablet Oral Q12H  . sodium chloride  3 mL Intravenous Q12H  . spironolactone  50 mg Oral Daily  . vancomycin  1,000 mg Intravenous Q8H     PRN Medications: sodium chloride, acetaminophen **OR** acetaminophen, carisoprodol, HYDROmorphone (DILAUDID) injection, ondansetron **OR** ondansetron (ZOFRAN) IV, oxyCODONE, Racepinephrine HCl, sodium chloride, zolpidem   Assessment and Plan:   1. Demand Ischemia:  Troponin I minimally elevated, not suggestive of ACS. No CAD per cath in 2010.  2. Acute on Chronic Diastolic CHF: Diuresed 2.0 liters since admission. Some improvement in edema. Abdomen still distended. Suspect additional component of right-sided heart failure with cor pulmonale.  3. COPD Exacerbation: Continues significant coughing, wheezing and O2 Desaturations, just by talking. Down to low 80's. On Venti mask now. Steroids, neb tx and abx per Hospitalists.   4. Uncontrolled diabetes: Difficult with steroids on board during acute treatment of COPD exacerbation. Titration per PTH.    Bettey MareKathryn M. Lawrence NP [facility]  11/11/2014, 8:00 AM    Attending  note:  Patient seen and examined. Agree with above assessment by Ms. Lawrence NP. Follow-up echocardiogram reviewed above showing vigorous LVEF with possible grade 2 diastolic dysfunction, also RV dysfunction consistent with cor pulmonale which would be overall consistent with her chronic lung disease. She is diuresing well on IV Lasix, nearly 2 L overall so far. No change in regimen at this time with stable renal function and improving clinical status. Hold off on resuming metolazone for now. Otherwise continue pulmonary supportive measures and management of cellulitis as per primary team.  Jonelle SidleSamuel G. McDowell, M.D., F.A.C.C.

## 2014-11-12 LAB — GLUCOSE, CAPILLARY
GLUCOSE-CAPILLARY: 413 mg/dL — AB (ref 70–99)
GLUCOSE-CAPILLARY: 434 mg/dL — AB (ref 70–99)
Glucose-Capillary: 366 mg/dL — ABNORMAL HIGH (ref 70–99)
Glucose-Capillary: 427 mg/dL — ABNORMAL HIGH (ref 70–99)
Glucose-Capillary: 448 mg/dL — ABNORMAL HIGH (ref 70–99)
Glucose-Capillary: 479 mg/dL — ABNORMAL HIGH (ref 70–99)
Glucose-Capillary: 496 mg/dL — ABNORMAL HIGH (ref 70–99)
Glucose-Capillary: 449 mg/dL — ABNORMAL HIGH (ref 70–99)
Glucose-Capillary: 461 mg/dL — ABNORMAL HIGH (ref 70–99)

## 2014-11-12 LAB — BASIC METABOLIC PANEL
Anion gap: 6 (ref 5–15)
BUN: 32 mg/dL — AB (ref 6–23)
CO2: 38 mmol/L — ABNORMAL HIGH (ref 19–32)
CREATININE: 0.96 mg/dL (ref 0.50–1.10)
Calcium: 8.4 mg/dL (ref 8.4–10.5)
Chloride: 87 mmol/L — ABNORMAL LOW (ref 96–112)
GFR calc Af Amer: 72 mL/min — ABNORMAL LOW (ref >90)
GFR calc non Af Amer: 62 mL/min — ABNORMAL LOW (ref >90)
Glucose, Bld: 395 mg/dL — ABNORMAL HIGH (ref 70–99)
Potassium: 5.2 mmol/L — ABNORMAL HIGH (ref 3.5–5.1)
Sodium: 131 mmol/L — ABNORMAL LOW (ref 135–145)

## 2014-11-12 MED ORDER — INSULIN ASPART 100 UNIT/ML ~~LOC~~ SOLN
6.0000 [IU] | Freq: Once | SUBCUTANEOUS | Status: AC
  Administered 2014-11-12: 6 [IU] via SUBCUTANEOUS

## 2014-11-12 MED ORDER — PRIMIDONE 50 MG PO TABS
ORAL_TABLET | ORAL | Status: AC
  Filled 2014-11-12: qty 1

## 2014-11-12 MED ORDER — FUROSEMIDE 10 MG/ML IJ SOLN
40.0000 mg | Freq: Two times a day (BID) | INTRAMUSCULAR | Status: DC
  Administered 2014-11-12 – 2014-11-14 (×4): 40 mg via INTRAVENOUS
  Filled 2014-11-12 (×4): qty 4

## 2014-11-12 MED ORDER — INSULIN GLARGINE 100 UNIT/ML ~~LOC~~ SOLN
78.0000 [IU] | Freq: Two times a day (BID) | SUBCUTANEOUS | Status: DC
  Administered 2014-11-12 – 2014-11-13 (×2): 78 [IU] via SUBCUTANEOUS
  Filled 2014-11-12 (×4): qty 0.78

## 2014-11-12 NOTE — Progress Notes (Signed)
Patient wore CPAP for about 10 minutes according to nurse. Suspect she never wears at home but also believe the CPAP setting of 17 with nasal mask and chin strap to hold mouth shut needs to be adjusted to a more comfortable setting. She may need a full face mask but doubt she would use it. Also a new sleep study may be needed?

## 2014-11-12 NOTE — Progress Notes (Signed)
Report called to Jennifer BoschMatthew Wright on [phone]. Patient ready for transport.

## 2014-11-12 NOTE — Progress Notes (Signed)
Dr. Ardyth HarpsHernandez paged to notify that patients blood sugar is greater than 400.

## 2014-11-12 NOTE — Progress Notes (Signed)
Notified MD, pt's blood sugar is 434.

## 2014-11-12 NOTE — Progress Notes (Signed)
[facility] PROGRESS NOTE  Jennifer Mejia ZOX:[id number] DOB: August 10, 1951 DOA: 11/09/2014 PCP: CRIM, [place] DAVID, MD  Assessment/Plan: Acute on Chronic Hypoxemic Respiratory Failure -2/2 COPD, CHF as well as OSA/OHS (see below for details). -Was able to titrate to [place] on 3/11 and continues to tolerate well. -Appreciate pulmonary input and recommendations.  COPD with Acute Exacerbation -Seems clinically improved. -Less wheezing, altho still present. -Continue steroids at current dose/nebs. -IS and flutter valve ordered by pulmonary.  OSA -CPAP qHs. -Has been non-compliant with CPAP in the hospital.  Acute on Chronic Diastolic CHF -Given good diuresis, will decrease lasix to 40 mg IV BID. -Is 5.9L negative since admission. -ECHO: EF 65-70%, LVH, component of right heart failure c/w her pulmonary issues.  Demand Ischemia -Troponins stable around 0.20. -Likely related to ongoing pulmonary issues. -no cardiac work up planned for at present. -ECHO as above. -Appreciate cardiology assistance.  ?bilateral LE Cellulitis -not convinced this is cellulitis. -looks more like venous insufficiency to me. -Vancomycin discontinued 3/11.  Morbid Obesity  DM -CBGs elevated. -Will continue to increase lantus to 78 units BID.  Code Status: Full Code Family Communication: patient only Disposition Plan: Transfer to floor.   Consultants:  Cardiology  Pulmonary   Antibiotics:  None  Subjective: SOB improved from yesterday. Still c/o audible wheezing.  Objective: Filed Vitals:   11/12/14 1000 11/12/14 1209 11/12/14 1340 11/12/14 1445  BP: 146/50   123/56  Pulse: 70   59  Temp:  98.4 F (36.9 C)  98.1 F (36.7 C)  TempSrc:  Oral    Resp: 19   20  Height:      Weight:      SpO2: 88%  89% 92%    Intake/Output Summary (Last 24 hours) at 11/12/14 1603 Last data filed at 11/12/14 1209  Gross per 24 hour  Intake   1260 ml  Output   4300 ml  Net  -3040 ml    Filed Weights   11/10/14 0500 11/11/14 0500 11/12/14 0400  Weight: 150 kg (330 lb 11 oz) 152.5 kg (336 lb 3.2 oz) 152.5 kg (336 lb 3.2 oz)    Exam:   General:  AA Ox3  Cardiovascular: RRR  Respiratory: Diffuse expiratory wheezing  Abdomen: S/NT/ND/+BS  Extremities: 2-3++ edema, redness over bilateral lower extremities from ankles to right below knees.   Neurologic:  Non-focal  Data Reviewed: Basic Metabolic Panel:  Recent Labs Lab 11/09/14 1708 11/10/14 0450 11/10/14 2200 11/11/14 0513 11/12/14 0545  NA 135 132* 129* 131* 131*  K 3.4* 4.6 5.3* 4.8 5.2*  CL 93* 92* 90* 89* 87*  CO2 35* 32 33* 35* 38*  GLUCOSE 221* 343* 459* 404* 395*  BUN 16 19 27* 29* 32*  CREATININE 0.95 0.85 1.25* 0.98 0.96  CALCIUM 8.2* 8.0* 7.9* 8.1* 8.4   Liver Function Tests:  Recent Labs Lab 11/10/14 0450  AST 17  ALT 13  ALKPHOS 81  BILITOT 0.6  PROT 6.5  ALBUMIN 3.2*   No results for input(s): LIPASE, AMYLASE in the last 168 hours. No results for input(s): AMMONIA in the last 168 hours. CBC:  Recent Labs Lab 11/09/14 1708 11/10/14 0450 11/11/14 0513  WBC 9.8 10.0 10.0  NEUTROABS 8.1*  --   --   HGB 13.0 13.5 13.7  HCT 42.1 43.6 43.2  MCV 85.4 84.8 85.2  PLT 237 198 213   Cardiac Enzymes:  Recent Labs Lab 11/09/14 1708 11/09/14 2334 11/10/14 0450 11/10/14 1250  TROPONINI 0.27* 0.16*  0.20* 0.10*   BNP (last 3 results)  Recent Labs  11/09/14 1708  BNP 105.0*    ProBNP (last 3 results) No results for input(s): PROBNP in the last 8760 hours.  CBG:  Recent Labs Lab 11/11/14 1613 11/11/14 1949 11/12/14 0003 11/12/14 0727 11/12/14 1136  GLUCAP 448* 496* 427* 366* 449*    Recent Results (from the past 240 hour(s))  Culture, blood (routine x 2)     Status: None (Preliminary result)   Collection Time: 11/09/14  5:13 PM  Result Value Ref Range Status   Specimen Description BLOOD RIGHT ANTECUBITAL  Final   Special Requests BOTTLES DRAWN AEROBIC  AND ANAEROBIC 8CC  Final   Culture NO GROWTH 3 DAYS  Final   Report Status PENDING  Incomplete  Culture, blood (routine x 2)     Status: None (Preliminary result)   Collection Time: 11/09/14  5:20 PM  Result Value Ref Range Status   Specimen Description BLOOD LEFT HAND  Final   Special Requests BOTTLES DRAWN AEROBIC AND ANAEROBIC 6CC  Final   Culture NO GROWTH 3 DAYS  Final   Report Status PENDING  Incomplete  Urine culture     Status: None   Collection Time: 11/09/14  5:52 PM  Result Value Ref Range Status   Specimen Description URINE, CATHETERIZED  Final   Special Requests NONE  Final   Colony Count NO GROWTH Performed at [facility]   Final   Culture NO GROWTH Performed at [facility]   Final   Report Status 11/11/2014 FINAL  Final  MRSA PCR Screening     Status: None   Collection Time: 11/09/14 11:30 PM  Result Value Ref Range Status   MRSA by PCR NEGATIVE NEGATIVE Final    Comment:        The GeneXpert MRSA Assay (FDA approved for NASAL specimens only), is one component of a comprehensive MRSA colonization surveillance program. It is not intended to diagnose MRSA infection nor to guide or monitor treatment for MRSA infections.      Studies: No results found.  Scheduled Meds: . antiseptic oral rinse  7 mL Mouth Rinse BID  . ARIPiprazole  10 mg Oral Daily  . atorvastatin  80 mg Oral QHS  . budesonide-formoterol  2 puff Inhalation BID  . clonazePAM  2 mg Oral QHS  . diltiazem  120 mg Oral Daily  . docusate sodium  100 mg Oral BID  . DULoxetine  60 mg Oral Daily  . enoxaparin (LOVENOX) injection  75 mg Subcutaneous Q24H  . fluticasone  2 spray Each Nare Daily  . furosemide  80 mg Intravenous Q12H  . gabapentin  1,600 mg Oral QHS  . gabapentin  800 mg Oral 4 times per day  . guaiFENesin  1,200 mg Oral BID  . insulin aspart  0-20 Units Subcutaneous TID WC  . insulin aspart  0-5 Units Subcutaneous QHS  . insulin aspart  10 Units Subcutaneous  TID WC  . insulin glargine  72 Units Subcutaneous BID  . ipratropium-albuterol  3 mL Nebulization Q6H  . levothyroxine  50 mcg Oral QAC breakfast  . loratadine  10 mg Oral QPC supper  . methylPREDNISolone (SOLU-MEDROL) injection  80 mg Intravenous Q6H  . montelukast  10 mg Oral QHS  . OxyCODONE  60 mg Oral TID  . pantoprazole  40 mg Oral BID  . primidone  50 mg Oral BID  . ramipril  10 mg Oral Daily  .  senna-docusate  2 tablet Oral Q12H  . sodium chloride  3 mL Intravenous Q12H  . spironolactone  50 mg Oral Daily   Continuous Infusions:   Principal Problem:   COPD exacerbation Active Problems:   Acute on chronic diastolic CHF (congestive heart failure)   Diabetes mellitus   Cellulitis    Time spent: 25 minutes. Greater than 50% of this time was spent in direct contact with the patient coordinating care.    Chaya JanHERNANDEZ ACOSTA,ESTELA  [facility] Pager [phone]  If 7PM-7AM, please contact night-coverage at [email or web address], password [email or web address] 11/12/2014, 4:03 PM  LOS: 3 days

## 2014-11-13 LAB — GLUCOSE, CAPILLARY
GLUCOSE-CAPILLARY: 358 mg/dL — AB (ref 70–99)
GLUCOSE-CAPILLARY: 381 mg/dL — AB (ref 70–99)
Glucose-Capillary: 404 mg/dL — ABNORMAL HIGH (ref 70–99)
Glucose-Capillary: 551 mg/dL (ref 70–99)
Glucose-Capillary: 414 mg/dL — ABNORMAL HIGH (ref 70–99)

## 2014-11-13 LAB — BASIC METABOLIC PANEL
Anion gap: 8 (ref 5–15)
BUN: 31 mg/dL — AB (ref 6–23)
CO2: 37 mmol/L — AB (ref 19–32)
Calcium: 8.7 mg/dL (ref 8.4–10.5)
Chloride: 84 mmol/L — ABNORMAL LOW (ref 96–112)
Creatinine, Ser: 0.79 mg/dL (ref 0.50–1.10)
GFR calc Af Amer: >90 mL/min (ref >90)
GFR calc non Af Amer: 87 mL/min — ABNORMAL LOW (ref >90)
Glucose, Bld: 397 mg/dL — ABNORMAL HIGH (ref 70–99)
Potassium: 5 mmol/L (ref 3.5–5.1)
SODIUM: 129 mmol/L — AB (ref 135–145)

## 2014-11-13 MED ORDER — INSULIN GLARGINE 100 UNIT/ML ~~LOC~~ SOLN
10.0000 [IU] | Freq: Once | SUBCUTANEOUS | Status: AC
  Administered 2014-11-13: 10 [IU] via SUBCUTANEOUS
  Filled 2014-11-13: qty 0.1

## 2014-11-13 MED ORDER — NICOTINE 21 MG/24HR TD PT24
21.0000 mg | MEDICATED_PATCH | Freq: Every day | TRANSDERMAL | Status: DC
  Administered 2014-11-13 – 2014-11-14 (×2): 21 mg via TRANSDERMAL
  Filled 2014-11-13 (×2): qty 1

## 2014-11-13 MED ORDER — INSULIN GLARGINE 100 UNIT/ML ~~LOC~~ SOLN
88.0000 [IU] | Freq: Two times a day (BID) | SUBCUTANEOUS | Status: DC
  Administered 2014-11-13 – 2014-11-14 (×2): 88 [IU] via SUBCUTANEOUS
  Filled 2014-11-13 (×4): qty 0.88

## 2014-11-13 MED ORDER — INSULIN ASPART 100 UNIT/ML ~~LOC~~ SOLN
25.0000 [IU] | Freq: Once | SUBCUTANEOUS | Status: AC
  Administered 2014-11-13: 25 [IU] via SUBCUTANEOUS

## 2014-11-13 MED ORDER — METHYLPREDNISOLONE SODIUM SUCC 125 MG IJ SOLR
60.0000 mg | Freq: Four times a day (QID) | INTRAMUSCULAR | Status: DC
  Administered 2014-11-13 – 2014-11-14 (×4): 60 mg via INTRAVENOUS
  Filled 2014-11-13 (×4): qty 2

## 2014-11-13 NOTE — Progress Notes (Signed)
[facility] PROGRESS NOTE  Judye Lorino ZOX:[id number] DOB: 1950/12/07 DOA: 11/09/2014 PCP: CRIM, [place] DAVID, MD  Assessment/Plan: Acute on Chronic Hypoxemic Respiratory Failure -2/2 COPD, CHF as well as OSA/OHS (see below for details). -Was able to titrate to [place] on 3/11 and continues to tolerate well. -Appreciate pulmonary input and recommendations.  COPD with Acute Exacerbation -Seems clinically improved. -Much less wheezing on exam today. -Start steroid titration today. -IS and flutter valve ordered by pulmonary. -Will start PT for exercise tolerance.  OSA -CPAP qHs. -Has been non-compliant with CPAP in the hospital.  Acute on Chronic Diastolic CHF -Given good diuresis, will decrease lasix to 40 mg IV BID. Home dose is 40 mg BID. -Is 11.5 L negative since admission (still appears volume overloaded on exam). -ECHO: EF 65-70%, LVH, component of right heart failure c/w her pulmonary issues.  Demand Ischemia -Troponins stable around 0.20. -Likely related to ongoing pulmonary issues. -no cardiac work up planned for at present. -ECHO as above. -Appreciate cardiology assistance.  ?bilateral LE Cellulitis -not convinced this is cellulitis. -looks more like venous insufficiency to me. -Vancomycin discontinued 3/11.  Morbid Obesity  DM -Still with difficulty controlling CBGs, likely related to ongoing steroid use. -Hopefully will start to come down with steroid titration. -Will increase lantus to 88 units BID. -Is on a resistant SSI and 10 units TID of meal coverage. -No signs of DKA. -If continues to be >500 may need to place on insulin drip.   Code Status: Full Code Family Communication: patient only Disposition Plan: Home when ready   Consultants:  Cardiology  Pulmonary   Antibiotics:  None  Subjective: SOB improved from yesterday.   Objective: Filed Vitals:   11/13/14 0123 11/13/14 0553 11/13/14 0744 11/13/14 0901  BP:  154/70      Pulse:  72    Temp:  98.5 F (36.9 C)    TempSrc:  Oral    Resp:  22    Height:      Weight:    149.233 kg (329 lb)  SpO2: 94% 91% 88%     Intake/Output Summary (Last 24 hours) at 11/13/14 1352 Last data filed at 11/13/14 0855  Gross per 24 hour  Intake    360 ml  Output   5900 ml  Net  -5540 ml   Filed Weights   11/11/14 0500 11/12/14 0400 11/13/14 0901  Weight: 152.5 kg (336 lb 3.2 oz) 152.5 kg (336 lb 3.2 oz) 149.233 kg (329 lb)    Exam:   General:  AA Ox3  Cardiovascular: RRR  Respiratory: mild wheezing.  Abdomen: S/NT/ND/+BS  Extremities: 2-3++ edema, redness over bilateral lower extremities from ankles to right below knees.   Neurologic:  Non-focal  Data Reviewed: Basic Metabolic Panel:  Recent Labs Lab 11/10/14 0450 11/10/14 2200 11/11/14 0513 11/12/14 0545 11/13/14 0708  NA 132* 129* 131* 131* 129*  K 4.6 5.3* 4.8 5.2* 5.0  CL 92* 90* 89* 87* 84*  CO2 32 33* 35* 38* 37*  GLUCOSE 343* 459* 404* 395* 397*  BUN 19 27* 29* 32* 31*  CREATININE 0.85 1.25* 0.98 0.96 0.79  CALCIUM 8.0* 7.9* 8.1* 8.4 8.7   Liver Function Tests:  Recent Labs Lab 11/10/14 0450  AST 17  ALT 13  ALKPHOS 81  BILITOT 0.6  PROT 6.5  ALBUMIN 3.2*   No results for input(s): LIPASE, AMYLASE in the last 168 hours. No results for input(s): AMMONIA in the last 168 hours. CBC:  Recent Labs Lab  11/09/14 1708 11/10/14 0450 11/11/14 0513  WBC 9.8 10.0 10.0  NEUTROABS 8.1*  --   --   HGB 13.0 13.5 13.7  HCT 42.1 43.6 43.2  MCV 85.4 84.8 85.2  PLT 237 198 213   Cardiac Enzymes:  Recent Labs Lab 11/09/14 1708 11/09/14 2334 11/10/14 0450 11/10/14 1250  TROPONINI 0.27* 0.16* 0.20* 0.10*   BNP (last 3 results)  Recent Labs  11/09/14 1708  BNP 105.0*    ProBNP (last 3 results) No results for input(s): PROBNP in the last 8760 hours.  CBG:  Recent Labs Lab 11/12/14 1136 11/12/14 1700 11/12/14 2150 11/13/14 0755 11/13/14 1137  GLUCAP 449* 434*  461* 404* 551*    Recent Results (from the past 240 hour(s))  Culture, blood (routine x 2)     Status: None (Preliminary result)   Collection Time: 11/09/14  5:13 PM  Result Value Ref Range Status   Specimen Description BLOOD RIGHT ANTECUBITAL  Final   Special Requests BOTTLES DRAWN AEROBIC AND ANAEROBIC 8CC  Final   Culture NO GROWTH 4 DAYS  Final   Report Status PENDING  Incomplete  Culture, blood (routine x 2)     Status: None (Preliminary result)   Collection Time: 11/09/14  5:20 PM  Result Value Ref Range Status   Specimen Description BLOOD LEFT HAND  Final   Special Requests BOTTLES DRAWN AEROBIC AND ANAEROBIC 6CC  Final   Culture NO GROWTH 4 DAYS  Final   Report Status PENDING  Incomplete  Urine culture     Status: None   Collection Time: 11/09/14  5:52 PM  Result Value Ref Range Status   Specimen Description URINE, CATHETERIZED  Final   Special Requests NONE  Final   Colony Count NO GROWTH Performed at [facility]   Final   Culture NO GROWTH Performed at [facility]   Final   Report Status 11/11/2014 FINAL  Final  MRSA PCR Screening     Status: None   Collection Time: 11/09/14 11:30 PM  Result Value Ref Range Status   MRSA by PCR NEGATIVE NEGATIVE Final    Comment:        The GeneXpert MRSA Assay (FDA approved for NASAL specimens only), is one component of a comprehensive MRSA colonization surveillance program. It is not intended to diagnose MRSA infection nor to guide or monitor treatment for MRSA infections.      Studies: No results found.  Scheduled Meds: . antiseptic oral rinse  7 mL Mouth Rinse BID  . ARIPiprazole  10 mg Oral Daily  . atorvastatin  80 mg Oral QHS  . budesonide-formoterol  2 puff Inhalation BID  . clonazePAM  2 mg Oral QHS  . diltiazem  120 mg Oral Daily  . docusate sodium  100 mg Oral BID  . DULoxetine  60 mg Oral Daily  . enoxaparin (LOVENOX) injection  75 mg Subcutaneous Q24H  . fluticasone  2 spray Each  Nare Daily  . furosemide  40 mg Intravenous Q12H  . gabapentin  1,600 mg Oral QHS  . gabapentin  800 mg Oral 4 times per day  . guaiFENesin  1,200 mg Oral BID  . insulin aspart  0-20 Units Subcutaneous TID WC  . insulin aspart  0-5 Units Subcutaneous QHS  . insulin aspart  10 Units Subcutaneous TID WC  . insulin glargine  88 Units Subcutaneous BID  . ipratropium-albuterol  3 mL Nebulization Q6H  . levothyroxine  50 mcg Oral QAC breakfast  .  loratadine  10 mg Oral QPC supper  . methylPREDNISolone (SOLU-MEDROL) injection  60 mg Intravenous Q6H  . montelukast  10 mg Oral QHS  . nicotine  21 mg Transdermal Daily  . OxyCODONE  60 mg Oral TID  . pantoprazole  40 mg Oral BID  . primidone  50 mg Oral BID  . ramipril  10 mg Oral Daily  . senna-docusate  2 tablet Oral Q12H  . sodium chloride  3 mL Intravenous Q12H  . spironolactone  50 mg Oral Daily   Continuous Infusions:   Principal Problem:   COPD exacerbation Active Problems:   Acute on chronic diastolic CHF (congestive heart failure)   Diabetes mellitus   Cellulitis    Time spent: 25 minutes. Greater than 50% of this time was spent in direct contact with the patient coordinating care.    Chaya JanHERNANDEZ ACOSTA,ESTELA  [facility] Pager [phone]  If 7PM-7AM, please contact night-coverage at [email or web address], password [email or web address] 11/13/2014, 1:52 PM  LOS: 4 days

## 2014-11-13 NOTE — Progress Notes (Signed)
Pt's CBG=551, Dr. Ardyth HarpsHernandez verbally made aware. New orders placed and will follow. Will continue to monitor patient.

## 2014-11-13 NOTE — Progress Notes (Signed)
Talk with patient about wearing CPAP machine and she stated "don't waste your time I don't won't it" I asked about her machine at home stated she just got a new one about a month ago and only wore it a couple of times.. From statements pt seem to be noncompliant with CPAP orders. Will continue to monitor patient throughout the night

## 2014-11-13 NOTE — Progress Notes (Signed)
Pt's CBG = 404, Dr. Ardyth HarpsHernandez verbally made aware. No new orders at this time. Will continue to monitor.

## 2014-11-14 LAB — BASIC METABOLIC PANEL
Anion gap: 7 (ref 5–15)
BUN: 29 mg/dL — ABNORMAL HIGH (ref 6–23)
CHLORIDE: 82 mmol/L — AB (ref 96–112)
CO2: 42 mmol/L — AB (ref 19–32)
CREATININE: 0.8 mg/dL (ref 0.50–1.10)
Calcium: 8.8 mg/dL (ref 8.4–10.5)
GFR calc non Af Amer: 77 mL/min — ABNORMAL LOW (ref >90)
GFR, EST AFRICAN AMERICAN: 90 mL/min — AB (ref >90)
Glucose, Bld: 330 mg/dL — ABNORMAL HIGH (ref 70–99)
POTASSIUM: 4.4 mmol/L (ref 3.5–5.1)
Sodium: 131 mmol/L — ABNORMAL LOW (ref 135–145)

## 2014-11-14 LAB — CULTURE, BLOOD (ROUTINE X 2)
CULTURE: NO GROWTH
Culture: NO GROWTH

## 2014-11-14 LAB — CBC
HEMATOCRIT: 47.8 % — AB (ref 36.0–46.0)
HEMOGLOBIN: 15.3 g/dL — AB (ref 12.0–15.0)
MCH: 26.7 pg (ref 26.0–34.0)
MCHC: 32 g/dL (ref 30.0–36.0)
MCV: 83.3 fL (ref 78.0–100.0)
Platelets: 251 10*3/uL (ref 150–400)
RBC: 5.74 MIL/uL — ABNORMAL HIGH (ref 3.87–5.11)
RDW: 15.3 % (ref 11.5–15.5)
WBC: 10.3 10*3/uL (ref 4.0–10.5)

## 2014-11-14 LAB — GLUCOSE, CAPILLARY
GLUCOSE-CAPILLARY: 457 mg/dL — AB (ref 70–99)
GLUCOSE-CAPILLARY: 433 mg/dL — AB (ref 70–99)
Glucose-Capillary: 327 mg/dL — ABNORMAL HIGH (ref 70–99)

## 2014-11-14 LAB — GLUCOSE, RANDOM: GLUCOSE: 481 mg/dL — AB (ref 70–99)

## 2014-11-14 MED ORDER — TORSEMIDE 20 MG PO TABS: 60.0000 mg | ORAL_TABLET | Freq: Two times a day (BID) | ORAL | Status: AC

## 2014-11-14 MED ORDER — AZITHROMYCIN 250 MG PO TABS: ORAL_TABLET | ORAL | Status: DC

## 2014-11-14 MED ORDER — TORSEMIDE 20 MG PO TABS: 60.0000 mg | ORAL_TABLET | Freq: Two times a day (BID) | ORAL | Status: DC

## 2014-11-14 MED ORDER — PREDNISONE 20 MG PO TABS
40.0000 mg | ORAL_TABLET | Freq: Every day | ORAL | Status: DC
  Administered 2014-11-14: 40 mg via ORAL
  Filled 2014-11-14: qty 2

## 2014-11-14 MED ORDER — PREDNISONE 10 MG PO TABS: 10.0000 mg | ORAL_TABLET | Freq: Every day | ORAL | Status: DC

## 2014-11-14 NOTE — Progress Notes (Signed)
CRITICAL VALUE ALERT  Critical value received:  CO2: 42  Date of notification:  11/14/2014  Time of notification:  0745  Critical value read back: Yes  Nurse who received alert:  Kriste BasqueBecky, RN  MD notified (1st page):  Dr. Ardyth HarpsHernandez  Time of first page:  [phone]

## 2014-11-14 NOTE — Progress Notes (Signed)
Patient was notified that she needs to have her own personal oxygen brought from home prior to discharge for transportation home. Patient and friends refused to get home oxygen. MD was notified and MD did not want patient to transport without her oxygen. Patient was made aware that without her oxygen during transportation that her oxygen level could drop and she could become unresponsive. Patient refused to let her friends go home and get her oxygen for transportation.

## 2014-11-14 NOTE — Progress Notes (Signed)
Inpatient Diabetes Program Recommendations  AACE/ADA: New Consensus Statement on Inpatient Glycemic Control (2013)  Target Ranges:  Prepandial:   less than 140 mg/dL      Peak postprandial:   less than 180 mg/dL (1-2 hours)      Critically ill patients:  140 - 180 mg/dL   Results for Burman BlacksmithORDQUIST, Maricel (MRN [id number]) as of 11/14/2014 08:40  Ref. Range 11/13/2014 07:55 11/13/2014 11:37 11/13/2014 14:03 11/13/2014 17:00 11/13/2014 23:10 11/14/2014 07:38  Glucose-Capillary Latest Range: 70-99 mg/dL 409404 (H) 811551 (HH) 914414 (H) 358 (H) 381 (H) 327 (H)   Diabetes history: DM2 Outpatient Diabetes medications: Lantus 60 units BID, Novolog 35 units TID with meals, Metformin 1000 mg BID, Januvia 100 mg daily Current orders for Inpatient glycemic control: Lantus 88 units BID, Novolog 0-20 units TID with meals, Novolog 0-5 units HS, Novolog 10 units TID with meals for meal coverage  Inpatient Diabetes Program Recommendations Insulin - Basal: Fasting glucose is 327 mg/dl this morning despite increasing Lantus to 88 units BID yesterday. If steroids are continued at current dose, please consider increasing to Lantus 95 units BID. Insulin - Meal Coverage: Please increase meal coverage to Novolog 25 units TID with meals.  Thanks, Orlando PennerMarie Layliana Devins, RN, MSN, CCRN, CDE Diabetes Coordinator Inpatient Diabetes Program [phone] (Team Pager) [phone] (AP office) [phone] [facility] office)

## 2014-11-14 NOTE — Progress Notes (Signed)
Notified by NT that patients blood sugar was 457. MD was notified. MD ordered to give patient 20 units of novolog with 10 units of meal coverage. Stat blood glucose was ordered. Will recheck patients blood sugar in one hour.

## 2014-11-14 NOTE — Progress Notes (Signed)
Jennifer Mejia discharged home alone with bayada per MD order.  Discharge instructions reviewed and discussed with the patient, all questions and concerns answered. Copy of instructions and scripts given to patient.     Medication List    STOP taking these medications        furosemide 80 MG tablet  Commonly known as:  LASIX     Magnesium 500 MG Tabs     metolazone 2.5 MG tablet  Commonly known as:  ZAROXOLYN     sitaGLIPtin 100 MG tablet  Commonly known as:  JANUVIA      TAKE these medications        albuterol 108 (90 BASE) MCG/ACT inhaler  Commonly known as:  PROVENTIL HFA;VENTOLIN HFA  Inhale 1-2 puffs into the lungs every 6 (six) hours as needed for wheezing or shortness of breath.     ARIPiprazole 10 MG tablet  Commonly known as:  ABILIFY  Take 10 mg by mouth daily.     atorvastatin 80 MG tablet  Commonly known as:  LIPITOR  Take 80 mg by mouth at bedtime.     azithromycin 250 MG tablet  Commonly known as:  ZITHROMAX  Take as directed     budesonide-formoterol 160-4.5 MCG/ACT inhaler  Commonly known as:  SYMBICORT  Inhale 2 puffs into the lungs 2 (two) times daily.     carisoprodol 350 MG tablet  Commonly known as:  SOMA  Take 350 mg by mouth 4 (four) times daily as needed for muscle spasms.     Cholecalciferol 1000 UNITS tablet  Take 1,000 Units by mouth daily.     clonazePAM 2 MG tablet  Commonly known as:  KLONOPIN  Take 2 mg by mouth at bedtime.     diltiazem 120 MG 24 hr capsule  Commonly known as:  CARDIZEM CD  Take 120 mg by mouth daily.     docusate sodium 100 MG capsule  Commonly known as:  COLACE  Take 100 mg by mouth 2 (two) times daily.     DULoxetine 60 MG capsule  Commonly known as:  CYMBALTA  Take 60 mg by mouth daily.     esomeprazole 40 MG capsule  Commonly known as:  NEXIUM  Take 40 mg by mouth 2 (two) times daily before a meal.     gabapentin 800 MG tablet  Commonly known as:  NEURONTIN  Take 800-1,600 mg by mouth 4  (four) times daily. Patient takes 1tab@6am ,1tab@10am ,1tab@2pm ,1tab@6pm ,2tab@@10pm      Insulin Glargine 300 UNIT/ML Sopn  Inject 120 Units into the skin 2 (two) times daily.     levocetirizine 5 MG tablet  Commonly known as:  XYZAL  Take 5 mg by mouth every evening.     levothyroxine 50 MCG tablet  Commonly known as:  SYNTHROID, LEVOTHROID  Take 50 mcg by mouth daily.     metFORMIN 1000 MG tablet  Commonly known as:  GLUCOPHAGE  Take 1,000 mg by mouth 2 (two) times daily with a meal.     mometasone 50 MCG/ACT nasal spray  Commonly known as:  NASONEX  Place 2 sprays into the nose daily.     montelukast 10 MG tablet  Commonly known as:  SINGULAIR  Take 10 mg by mouth at bedtime.     NOVOLOG FLEXPEN 100 UNIT/ML FlexPen  Generic drug:  insulin aspart  Inject 35 Units into the skin 3 (three) times daily before meals.     Olopatadine HCl 0.2 % Soln  Place 1 drop  into both eyes daily as needed (itching).     OxyCODONE HCl ER 60 MG T12a  Take 60 mg by mouth 3 (three) times daily.     oxyCODONE 15 MG immediate release tablet  Commonly known as:  ROXICODONE  Take 15 mg by mouth 4 (four) times daily.     potassium chloride SA 20 MEQ tablet  Commonly known as:  K-DUR,KLOR-CON  Take 20 mEq by mouth daily.     prednisoLONE acetate 1 % ophthalmic suspension  Commonly known as:  PRED FORTE  Place 1 drop into both eyes 4 (four) times daily.     predniSONE 10 MG tablet  Commonly known as:  DELTASONE  Take 1 tablet (10 mg total) by mouth daily with breakfast. Take 6 tablets today and then decrease by 1 tablet daily until none are left     primidone 50 MG tablet  Commonly known as:  MYSOLINE  Take 50 mg by mouth 2 (two) times daily.     ramipril 10 MG capsule  Commonly known as:  ALTACE  Take 10 mg by mouth daily.     ranitidine 150 MG capsule  Commonly known as:  ZANTAC  Take 150 mg by mouth 2 (two) times daily.     senna-docusate 8.6-50 MG per tablet  Commonly known as:   Senokot-S  Take 2 tablets by mouth every 12 (twelve) hours.     spironolactone 50 MG tablet  Commonly known as:  ALDACTONE  Take 50 mg by mouth daily.     tiotropium 18 MCG inhalation capsule  Commonly known as:  SPIRIVA  Place 18 mcg into inhaler and inhale daily.     torsemide 20 MG tablet  Commonly known as:  DEMADEX  Take 3 tablets (60 mg total) by mouth 2 (two) times daily.     zolpidem 12.5 MG CR tablet  Commonly known as:  AMBIEN CR  Take 12.5 mg by mouth at bedtime.        Patients skin is clean, dry and intact, no evidence of skin break down. IV site discontinued and catheter remains intact. Site without signs and symptoms of complications. Dressing and pressure applied.  Patient escorted to car by [facility], NT in a wheelchair,  no distress noted upon discharge.  Ubaldo Glassing 11/14/2014 4:42 PM

## 2014-11-14 NOTE — Evaluation (Signed)
Physical Therapy Evaluation Patient Details Name: Jennifer Mejia MRN: [id number] DOB: December 06, 1950 Today's Date: 11/14/2014   History of Present Illness  This is a 64 year old with a long known history of COPD CHF diabetes hypertension and thyroid disease. She says she's been sick for about for 5 days prior to coming to the hospital. She felt like she was having more cough and congestion more shortness of breath. She had not taken her Lasix for 2 or 3 days prior to coming because she says when she is short of breath she can't get to the bathroom in time if she takes her Lasix. Since she's been in the hospital she's been treated for CHF and COPD and although she has improved she is still having trouble with hypoxia. Her oxygen saturation tends to drop despite supplemental O2.  Pt lives alone with assist of CAP aide 7 hours/day.  Clinical Impression  Pt is found to be mildly deconditioned from her baseline.  She was able to ambulate 32' with a walker, good stability.  O2 on 4 L O2 with O2 sat=94% at rest and 93% with exertion.  She would like some HHPT to help her return to baseline.    Follow Up Recommendations Home health PT    Equipment Recommendations  None recommended by PT    Recommendations for Other Services   none    Precautions / Restrictions Precautions Precautions: None Restrictions Weight Bearing Restrictions: No      Mobility  Bed Mobility Overal bed mobility: Modified Independent             General bed mobility comments: with HOB elevated  Transfers Overall transfer level: Modified independent Equipment used: Rolling walker (2 wheeled)                Ambulation/Gait Ambulation/Gait assistance: Supervision Ambulation Distance (Feet): 50 Feet Assistive device: Rolling walker (2 wheeled) Gait Pattern/deviations: WFL(Within Functional Limits)   Gait velocity interpretation: at or above normal speed for age/gender General Gait Details: good  stability  Stairs            Wheelchair Mobility    Modified Rankin (Stroke Patients Only)       Balance Overall balance assessment: No apparent balance deficits (not formally assessed)                                           Pertinent Vitals/Pain Pain Assessment: 0-10 Pain Score: 8  Pain Location: legs and low back Pain Descriptors / Indicators: Aching;Sore Pain Intervention(s): Limited activity within patient's tolerance;Premedicated before session    [place] expects to be discharged to:: Private residence Living Arrangements: Alone Available Help at Discharge: Personal care attendant;Available PRN/intermittently Type of Home: Apartment Home Access: Level entry     Home Layout: One level Home Equipment: Walker - 4 wheels;Bedside commode;Shower seat;Wheelchair - power Additional Comments: sleeps in a lift chair    Prior Function Level of Independence: Needs assistance   Gait / Transfers Assistance Needed: transfers and ambulates independently  ADL's / Homemaking Assistance Needed: assist with all grooming and household activities        Hand Dominance        Extremity/Trunk Assessment               Lower Extremity Assessment: Generalized weakness (erythema to both calves)         Communication   Communication:  No difficulties  Cognition Arousal/Alertness: Awake/alert Behavior During Therapy: WFL for tasks assessed/performed Overall Cognitive Status: Within Functional Limits for tasks assessed                      General Comments      Exercises        Assessment/Plan    PT Assessment All further PT needs can be met in the next venue of care  PT Diagnosis Generalized weakness   PT Problem List Decreased strength;Decreased activity tolerance;Decreased mobility;Cardiopulmonary status limiting activity  PT Treatment Interventions     PT Goals (Current goals can be found in the Care  Plan section) Acute Rehab PT Goals PT Goal Formulation: All assessment and education complete, DC therapy    Frequency     Barriers to discharge  none      Co-evaluation               End of Session Equipment Utilized During Treatment: Gait belt;Oxygen Activity Tolerance: Patient tolerated treatment well Patient left: in bed;with call bell/phone within reach;with bed alarm set           Time: 0921-0942 PT Time Calculation (min) (ACUTE ONLY): 21 min   Charges:   PT Evaluation $Initial PT Evaluation Tier I: 1 Procedure     PT G CodesSable Feil 11/14/2014, 9:47 AM

## 2014-11-14 NOTE — Progress Notes (Signed)
Subjective: She says she feels better and wants to go home. She has no other new complaints. Her breathing is pretty good.  Objective: Vital signs in last 24 hours: Temp:  [98.5 F (36.9 C)-98.8 F (37.1 C)] 98.5 F (36.9 C) (03/14 0555) Pulse Rate:  [55-62] 55 (03/14 0555) Resp:  [20] 20 (03/14 0555) BP: (136-155)/(47-67) 136/67 mmHg (03/14 0555) SpO2:  [90 %-97 %] 90 % (03/14 0735) Weight:  [149.233 kg (329 lb)-149.3 kg (329 lb 2.4 oz)] 149.3 kg (329 lb 2.4 oz) (03/14 0555) Weight change:  Last BM Date: 11/09/14  Intake/Output from previous day: 03/13 0701 - 03/14 0700 In: 960 [P.O.:960] Out: 8625 [Urine:8625]  PHYSICAL EXAM General appearance: alert, cooperative and no distress Resp: clear to auscultation bilaterally Cardio: regular rate and rhythm, S1, S2 normal, no murmur, click, rub or gallop GI: soft, non-tender; bowel sounds normal; no masses,  no organomegaly Extremities: extremities normal, atraumatic, no cyanosis or edema  Lab Results:  Results for orders placed or performed during the hospital encounter of 11/09/14 (from the past 48 hour(s))  Glucose, capillary     Status: Abnormal   Collection Time: 11/12/14 11:36 AM  Result Value Ref Range   Glucose-Capillary 449 (H) 70 - 99 mg/dL   Comment 1 Notify RN    Comment 2 Document in Chart   Glucose, capillary     Status: Abnormal   Collection Time: 11/12/14  5:00 PM  Result Value Ref Range   Glucose-Capillary 434 (H) 70 - 99 mg/dL   Comment 1 Notify RN   Glucose, capillary     Status: Abnormal   Collection Time: 11/12/14  9:50 PM  Result Value Ref Range   Glucose-Capillary 461 (H) 70 - 99 mg/dL   Comment 1 Notify RN   Basic metabolic panel     Status: Abnormal   Collection Time: 11/13/14  7:08 AM  Result Value Ref Range   Sodium 129 (L) 135 - 145 mmol/L   Potassium 5.0 3.5 - 5.1 mmol/L   Chloride 84 (L) 96 - 112 mmol/L   CO2 37 (H) 19 - 32 mmol/L   Glucose, Bld 397 (H) 70 - 99 mg/dL   BUN 31 (H) 6 - 23  mg/dL   Creatinine, Ser 0.79 0.50 - 1.10 mg/dL   Calcium 8.7 8.4 - 10.5 mg/dL   GFR calc non Af Amer 87 (L) >90 mL/min   GFR calc Af Amer >90 >90 mL/min    Comment: (NOTE) The eGFR has been calculated using the CKD EPI equation. This calculation has not been validated in all clinical situations. eGFR's persistently <90 mL/min signify possible Chronic Kidney Disease.    Anion gap 8 5 - 15  Glucose, capillary     Status: Abnormal   Collection Time: 11/13/14  7:55 AM  Result Value Ref Range   Glucose-Capillary 404 (H) 70 - 99 mg/dL   Comment 1 Notify RN   Glucose, capillary     Status: Abnormal   Collection Time: 11/13/14 11:37 AM  Result Value Ref Range   Glucose-Capillary 551 (HH) 70 - 99 mg/dL   Comment 1 Repeat Test   Glucose, capillary     Status: Abnormal   Collection Time: 11/13/14  2:03 PM  Result Value Ref Range   Glucose-Capillary 414 (H) 70 - 99 mg/dL  Glucose, capillary     Status: Abnormal   Collection Time: 11/13/14  5:00 PM  Result Value Ref Range   Glucose-Capillary 358 (H) 70 - 99 mg/dL  Comment 1 Notify RN    Comment 2 Document in Chart   Glucose, capillary     Status: Abnormal   Collection Time: 11/13/14 11:10 PM  Result Value Ref Range   Glucose-Capillary 381 (H) 70 - 99 mg/dL   Comment 1 Notify RN   Basic metabolic panel     Status: Abnormal   Collection Time: 11/14/14  6:01 AM  Result Value Ref Range   Sodium 131 (L) 135 - 145 mmol/L   Potassium 4.4 3.5 - 5.1 mmol/L   Chloride 82 (L) 96 - 112 mmol/L   CO2 42 (HH) 19 - 32 mmol/L    Comment: CRITICAL RESULT CALLED TO, READ BACK BY AND VERIFIED WITH: BROWER,B AT 7:35AM ON 11/14/14 BY FESTERMAN,C    Glucose, Bld 330 (H) 70 - 99 mg/dL   BUN 29 (H) 6 - 23 mg/dL   Creatinine, Ser 0.80 0.50 - 1.10 mg/dL   Calcium 8.8 8.4 - 10.5 mg/dL   GFR calc non Af Amer 77 (L) >90 mL/min   GFR calc Af Amer 90 (L) >90 mL/min    Comment: (NOTE) The eGFR has been calculated using the CKD EPI equation. This  calculation has not been validated in all clinical situations. eGFR's persistently <90 mL/min signify possible Chronic Kidney Disease.    Anion gap 7 5 - 15  CBC     Status: Abnormal   Collection Time: 11/14/14  6:01 AM  Result Value Ref Range   WBC 10.3 4.0 - 10.5 K/uL   RBC 5.74 (H) 3.87 - 5.11 MIL/uL   Hemoglobin 15.3 (H) 12.0 - 15.0 g/dL   HCT 47.8 (H) 36.0 - 46.0 %   MCV 83.3 78.0 - 100.0 fL   MCH 26.7 26.0 - 34.0 pg   MCHC 32.0 30.0 - 36.0 g/dL   RDW 15.3 11.5 - 15.5 %   Platelets 251 150 - 400 K/uL  Glucose, capillary     Status: Abnormal   Collection Time: 11/14/14  7:38 AM  Result Value Ref Range   Glucose-Capillary 327 (H) 70 - 99 mg/dL    ABGS No results for input(s): PHART, PO2ART, TCO2, HCO3 in the last 72 hours.  Invalid input(s): PCO2 CULTURES Recent Results (from the past 240 hour(s))  Culture, blood (routine x 2)     Status: None (Preliminary result)   Collection Time: 11/09/14  5:13 PM  Result Value Ref Range Status   Specimen Description BLOOD RIGHT ANTECUBITAL  Final   Special Requests BOTTLES DRAWN AEROBIC AND ANAEROBIC 8CC  Final   Culture NO GROWTH 4 DAYS  Final   Report Status PENDING  Incomplete  Culture, blood (routine x 2)     Status: None (Preliminary result)   Collection Time: 11/09/14  5:20 PM  Result Value Ref Range Status   Specimen Description BLOOD LEFT HAND  Final   Special Requests BOTTLES DRAWN AEROBIC AND ANAEROBIC 6CC  Final   Culture NO GROWTH 4 DAYS  Final   Report Status PENDING  Incomplete  Urine culture     Status: None   Collection Time: 11/09/14  5:52 PM  Result Value Ref Range Status   Specimen Description URINE, CATHETERIZED  Final   Special Requests NONE  Final   Colony Count NO GROWTH Performed at [facility]   Final   Culture NO GROWTH Performed at [facility]   Final   Report Status 11/11/2014 FINAL  Final  MRSA PCR Screening     Status: None  Collection Time: 11/09/14 11:30 PM  Result  Value Ref Range Status   MRSA by PCR NEGATIVE NEGATIVE Final    Comment:        The GeneXpert MRSA Assay (FDA approved for NASAL specimens only), is one component of a comprehensive MRSA colonization surveillance program. It is not intended to diagnose MRSA infection nor to guide or monitor treatment for MRSA infections.    Studies/Results: No results found.  Medications:  Prior to Admission:  Prescriptions prior to admission  Medication Sig Dispense Refill Last Dose  . albuterol (PROVENTIL HFA;VENTOLIN HFA) 108 (90 BASE) MCG/ACT inhaler Inhale 1-2 puffs into the lungs every 6 (six) hours as needed for wheezing or shortness of breath.   11/09/2014 at Unknown time  . ARIPiprazole (ABILIFY) 10 MG tablet Take 10 mg by mouth daily.   11/09/2014 at Unknown time  . atorvastatin (LIPITOR) 80 MG tablet Take 80 mg by mouth at bedtime.   11/08/2014 at Unknown time  . budesonide-formoterol (SYMBICORT) 160-4.5 MCG/ACT inhaler Inhale 2 puffs into the lungs 2 (two) times daily.   11/09/2014 at Unknown time  . carisoprodol (SOMA) 350 MG tablet Take 350 mg by mouth 4 (four) times daily as needed for muscle spasms.   11/09/2014 at Unknown time  . Cholecalciferol 1000 UNITS tablet Take 1,000 Units by mouth daily.   11/09/2014 at Unknown time  . clonazePAM (KLONOPIN) 2 MG tablet Take 2 mg by mouth at bedtime.   11/08/2014 at Unknown time  . diltiazem (CARDIZEM CD) 120 MG 24 hr capsule Take 120 mg by mouth daily.   11/09/2014 at Unknown time  . docusate sodium (COLACE) 100 MG capsule Take 100 mg by mouth 2 (two) times daily.   11/09/2014 at Unknown time  . DULoxetine (CYMBALTA) 60 MG capsule Take 60 mg by mouth daily.   11/09/2014 at Unknown time  . esomeprazole (NEXIUM) 40 MG capsule Take 40 mg by mouth 2 (two) times daily before a meal.   11/09/2014 at Unknown time  . furosemide (LASIX) 80 MG tablet Take 80 mg by mouth 2 (two) times daily.   11/09/2014 at Unknown time  . gabapentin (NEURONTIN) 800 MG tablet Take 800-1,600 mg  by mouth 4 (four) times daily. Patient takes 1tab$RemoveBeforeDEI'@6am'ItGgVHQhylroqvZs$ ,1tab$RemoveB'@10am'JKEUoHIQ$ ,1tab$R'@2pm'IW$ ,1tab$Re'@6pm'gRC$ ,2tab@$Re'@10pm'Ijy$    11/09/2014 at Unknown time  . Insulin Glargine 300 UNIT/ML SOPN Inject 120 Units into the skin 2 (two) times daily.   11/09/2014 at Unknown time  . levocetirizine (XYZAL) 5 MG tablet Take 5 mg by mouth every evening.   11/08/2014 at Unknown time  . levothyroxine (SYNTHROID, LEVOTHROID) 50 MCG tablet Take 50 mcg by mouth daily.   11/09/2014 at Unknown time  . Magnesium 500 MG TABS Take 500 mg by mouth daily.   11/09/2014 at Unknown time  . metFORMIN (GLUCOPHAGE) 1000 MG tablet Take 1,000 mg by mouth 2 (two) times daily with a meal.   11/09/2014 at Unknown time  . metolazone (ZAROXOLYN) 2.5 MG tablet Take 2.5 mg by mouth 3 (three) times a week.   11/08/2014 at Unknown time  . mometasone (NASONEX) 50 MCG/ACT nasal spray Place 2 sprays into the nose daily.   11/09/2014 at Unknown time  . montelukast (SINGULAIR) 10 MG tablet Take 10 mg by mouth at bedtime.   11/08/2014 at Unknown time  . NOVOLOG FLEXPEN 100 UNIT/ML FlexPen Inject 35 Units into the skin 3 (three) times daily before meals.    11/09/2014 at Unknown time  . Olopatadine HCl 0.2 % SOLN Place 1 drop into both  eyes daily as needed (itching).   unknown  . oxyCODONE (ROXICODONE) 15 MG immediate release tablet Take 15 mg by mouth 4 (four) times daily.   11/09/2014 at Unknown time  . Oxycodone HCl 10 MG TABS Take 10 mg by mouth 4 (four) times daily.   11/09/2014 at Unknown time  . OxyCODONE HCl ER 60 MG T12A Take 60 mg by mouth 3 (three) times daily.   11/09/2014 at Unknown time  . potassium chloride SA (K-DUR,KLOR-CON) 20 MEQ tablet Take 20 mEq by mouth daily.   11/09/2014 at Unknown time  . prednisoLONE acetate (PRED FORTE) 1 % ophthalmic suspension Place 1 drop into both eyes 4 (four) times daily.   11/09/2014 at Unknown time  . primidone (MYSOLINE) 50 MG tablet Take 50 mg by mouth 2 (two) times daily.   11/09/2014 at Unknown time  . ramipril (ALTACE) 10 MG capsule Take 10 mg by mouth  daily.   11/09/2014 at Unknown time  . ranitidine (ZANTAC) 150 MG capsule Take 150 mg by mouth 2 (two) times daily.   11/09/2014 at Unknown time  . senna-docusate (SENOKOT-S) 8.6-50 MG per tablet Take 2 tablets by mouth every 12 (twelve) hours.   11/09/2014 at Unknown time  . sitaGLIPtin (JANUVIA) 100 MG tablet Take 100 mg by mouth daily.   11/09/2014 at Unknown time  . spironolactone (ALDACTONE) 50 MG tablet Take 50 mg by mouth daily.   11/09/2014 at Unknown time  . tiotropium (SPIRIVA) 18 MCG inhalation capsule Place 18 mcg into inhaler and inhale daily.   11/09/2014 at Unknown time  . zolpidem (AMBIEN CR) 12.5 MG CR tablet Take 12.5 mg by mouth at bedtime.   11/08/2014 at Unknown time   Scheduled: . antiseptic oral rinse  7 mL Mouth Rinse BID  . ARIPiprazole  10 mg Oral Daily  . atorvastatin  80 mg Oral QHS  . budesonide-formoterol  2 puff Inhalation BID  . clonazePAM  2 mg Oral QHS  . diltiazem  120 mg Oral Daily  . docusate sodium  100 mg Oral BID  . DULoxetine  60 mg Oral Daily  . enoxaparin (LOVENOX) injection  75 mg Subcutaneous Q24H  . fluticasone  2 spray Each Nare Daily  . furosemide  40 mg Intravenous Q12H  . gabapentin  1,600 mg Oral QHS  . gabapentin  800 mg Oral 4 times per day  . guaiFENesin  1,200 mg Oral BID  . insulin aspart  0-20 Units Subcutaneous TID WC  . insulin aspart  0-5 Units Subcutaneous QHS  . insulin aspart  10 Units Subcutaneous TID WC  . insulin glargine  88 Units Subcutaneous BID  . ipratropium-albuterol  3 mL Nebulization Q6H  . levothyroxine  50 mcg Oral QAC breakfast  . loratadine  10 mg Oral QPC supper  . montelukast  10 mg Oral QHS  . nicotine  21 mg Transdermal Daily  . OxyCODONE  60 mg Oral TID  . pantoprazole  40 mg Oral BID  . predniSONE  40 mg Oral Q breakfast  . primidone  50 mg Oral BID  . ramipril  10 mg Oral Daily  . senna-docusate  2 tablet Oral Q12H  . sodium chloride  3 mL Intravenous Q12H  . spironolactone  50 mg Oral Daily   Continuous:   QRF:XJOITG chloride, acetaminophen **OR** acetaminophen, carisoprodol, HYDROmorphone (DILAUDID) injection, ondansetron **OR** ondansetron (ZOFRAN) IV, oxyCODONE, Racepinephrine HCl, Racepinephrine HCl, sodium chloride, zolpidem  Assesment: She was admitted with COPD exacerbation and has  acute on chronic diastolic heart failure. Her blood sugar has still been elevated. A lot of this is likely due to her IV steroids and she so much better from her lung situation now that I think we can stop the IV steroids Principal Problem:   COPD exacerbation Active Problems:   Acute on chronic diastolic CHF (congestive heart failure)   Diabetes mellitus   Cellulitis    Plan: I stopped IV steroids and switched her to by mouth.    LOS: 5 days   Ifrah Vest L 11/14/2014, 8:53 AM

## 2014-11-14 NOTE — Progress Notes (Signed)
Consulting cardiologist: Dina Rich  Primary Cardiologist:Moore, Minerva Areola [facility])  Cardiology Specific Problem List: 1. Abnormal Troponin 2. Diastolic CHF  Subjective:   "Feel good enough to go home.I can get better there."  Breathing better. Objective:   Temp:  [98.5 F (36.9 C)-98.8 F (37.1 C)] 98.5 F (36.9 C) (03/14 0555) Pulse Rate:  [55-62] 55 (03/14 0555) Resp:  [20] 20 (03/14 0555) BP: (136-155)/(47-67) 136/67 mmHg (03/14 0555) SpO2:  [90 %-97 %] 90 % (03/14 0735) Weight:  [329 lb (149.233 kg)-329 lb 2.4 oz (149.3 kg)] 329 lb 2.4 oz (149.3 kg) (03/14 0555) Last BM Date: 11/09/14  Filed Weights   11/12/14 0400 11/13/14 0901 11/14/14 0555  Weight: 336 lb 3.2 oz (152.5 kg) 329 lb (149.233 kg) 329 lb 2.4 oz (149.3 kg)    Intake/Output Summary (Last 24 hours) at 11/14/14 0802 Last data filed at 11/14/14 0557  Gross per 24 hour  Intake    960 ml  Output   8625 ml  Net  -7665 ml   Total Output During Admission: 17,144.5  Telemetry: NSR  Exam:  General: No acute distress.  HEENT: Conjunctiva and lids normal, oropharynx clear.  Lungs: Clear to auscultation, nonlabored.  Cardiac: No elevated JVP or bruits. RRR, no gallop or rub.   Abdomen: Normoactive bowel sounds, nontender, nondistended.  Extremities: No pitting edema, distal pulses full.  Neuropsychiatric: Alert and oriented x3, affect appropriate.  Echocardiogram: 11/10/2014 Left ventricle: The cavity size was normal. Wall thickness was increased in a pattern of mild LVH. Systolic function was vigorous. The estimated ejection fraction was in the range of 65% to 70%. Wall motion was normal; there were no regional wall motion abnormalities. - Aortic valve: Poorly visualized. Mildly calcified annulus. Probably trileaflet. There was no stenosis. Mean gradient (S): 11 mm Hg. Valve area (VTI): 2.06 cm^2. - Mitral valve: Calcified annulus. - Left atrium: The atrium was at the upper limits  of normal in size. - Right ventricle: The cavity size was moderately dilated. Systolic function was mildly reduced. - Right atrium: The atrium was moderately dilated. - Tricuspid valve: There was trivial regurgitation. - Pulmonary arteries: Systolic pressure could not be accurately estimated. - Inferior vena cava: Not visualized. Unable to estimate CVP. - Pericardium, extracardiac: There was no pericardial effusion.  Lab Results:  Basic Metabolic Panel:  Recent Labs Lab 11/12/14 0545 11/13/14 0708 11/14/14 0601  NA 131* 129* 131*  K 5.2* 5.0 4.4  CL 87* 84* 82*  CO2 38* 37* 42*  GLUCOSE 395* 397* 330*  BUN 32* 31* 29*  CREATININE 0.96 0.79 0.80  CALCIUM 8.4 8.7 8.8    Liver Function Tests:  Recent Labs Lab 11/10/14 0450  AST 17  ALT 13  ALKPHOS 81  BILITOT 0.6  PROT 6.5  ALBUMIN 3.2*    CBC:  Recent Labs Lab 11/10/14 0450 11/11/14 0513 11/14/14 0601  WBC 10.0 10.0 10.3  HGB 13.5 13.7 15.3*  HCT 43.6 43.2 47.8*  MCV 84.8 85.2 83.3  PLT 198 213 251    Cardiac Enzymes:  Recent Labs Lab 11/09/14 2334 11/10/14 0450 11/10/14 1250  TROPONINI 0.16* 0.20* 0.10*     Medications:   Scheduled Medications: . antiseptic oral rinse  7 mL Mouth Rinse BID  . ARIPiprazole  10 mg Oral Daily  . atorvastatin  80 mg Oral QHS  . budesonide-formoterol  2 puff Inhalation BID  . clonazePAM  2 mg Oral QHS  . diltiazem  120 mg Oral Daily  .  docusate sodium  100 mg Oral BID  . DULoxetine  60 mg Oral Daily  . enoxaparin (LOVENOX) injection  75 mg Subcutaneous Q24H  . fluticasone  2 spray Each Nare Daily  . furosemide  40 mg Intravenous Q12H  . gabapentin  1,600 mg Oral QHS  . gabapentin  800 mg Oral 4 times per day  . guaiFENesin  1,200 mg Oral BID  . insulin aspart  0-20 Units Subcutaneous TID WC  . insulin aspart  0-5 Units Subcutaneous QHS  . insulin aspart  10 Units Subcutaneous TID WC  . insulin glargine  88 Units Subcutaneous BID  .  ipratropium-albuterol  3 mL Nebulization Q6H  . levothyroxine  50 mcg Oral QAC breakfast  . loratadine  10 mg Oral QPC supper  . methylPREDNISolone (SOLU-MEDROL) injection  60 mg Intravenous Q6H  . montelukast  10 mg Oral QHS  . nicotine  21 mg Transdermal Daily  . OxyCODONE  60 mg Oral TID  . pantoprazole  40 mg Oral BID  . primidone  50 mg Oral BID  . ramipril  10 mg Oral Daily  . senna-docusate  2 tablet Oral Q12H  . sodium chloride  3 mL Intravenous Q12H  . spironolactone  50 mg Oral Daily    PRN Medications: sodium chloride, acetaminophen **OR** acetaminophen, carisoprodol, HYDROmorphone (DILAUDID) injection, ondansetron **OR** ondansetron (ZOFRAN) IV, oxyCODONE, Racepinephrine HCl, Racepinephrine HCl, sodium chloride, zolpidem   Assessment and Plan:   1.Acute on chronic diastolic HF: She has diuresed 54.017.5 liters since admission with improvement in symptoms of edema. She continues to have difficulty breathing. This is chronic for her. Will transition to po torsemide this am 60 mg BID in am. Continue metolazone 3 times a week as at home. Creatinine 0.80. [facility] and will need follow up with him if discharged.  2. Demand ischemia: Related to hypoxia and CHF.  3. COPD Exacerbation: Continues coarse breath sounds, inspiratory and expiratory wheezes and rhonchi. Pulmonary management.    Bettey MareKathryn M. Lawrence NP [facility]  11/14/2014, 8:02 AM   Patient seen and discussed with NP Lyman BishopLawrence, I agree with her documentation above. 64 yo female hx of lymphedema, chronic diastolic heart failure, COPD on home O2, DM2 admitted with SOB thought to be multifactorial including COPD exacerbation and acute on chronic diastolic heart failure. Negative 7.6 liters yesterday by charting, negative 17 liters since admission. Cr and BUN stable, uptrend in CO2 and downtrend in Cl. Significant increase in cell counts suggest she is becoming hemoconcentrated, perhaps from such aggressive  diuresis yesterday. She is on lasix IV 40mg  bid.  Weights appear to be inaccurate, documented around 330lbs since admit despite diuresis. Mild trop elevation suspect due to demand ischemia in setting of heart failure and hypoxia, she has had a previous cath without significant disease. Reports breathing is near her baseline, we will change her to oral diuretic. Change furosemide to toresmide for more reliable and potent diuretic effect, start 60mg  bid. She will need close f/u with her primary cardiologist at discharge.    11/10/14 echo: LVEF 65-70%, no WMAs, abnormal diastolic function, mild RV dysfunction, cannot eval PASP, IVC not visualized.   Dominga FerryJ Tenae Graziosi MD

## 2014-11-14 NOTE — Discharge Summary (Signed)
Physician Discharge Summary  Jennifer Mejia ZOX:[id number] DOB: 27-Jun-1951 DOA: 11/09/2014  PCP: CRIM, [place] DAVID, MD  Admit date: 11/09/2014 Discharge date: 11/14/2014  Time spent: 45 minutes  Recommendations for Outpatient Follow-up:  -Will be discharged home today. -Advised to follow up with PCP in 1 week.   Discharge Diagnoses:  Principal Problem:   COPD exacerbation Active Problems:   Acute on chronic diastolic CHF (congestive heart failure)   Diabetes mellitus   Cellulitis   Discharge Condition: Stable and improved  Filed Weights   11/12/14 0400 11/13/14 0901 11/14/14 0555  Weight: 152.5 kg (336 lb 3.2 oz) 149.233 kg (329 lb) 149.3 kg (329 lb 2.4 oz)    History of present illness:  64 year old female who  has a past medical history of COPD (chronic obstructive pulmonary disease); Diabetes mellitus without complication; Hypertension; Anxiety; Thyroid disease; Hypoactive thyroid; and Chronic congestive heart failure. Today came to the ED with chief complaint of shortness of breath since this morning, she also has been coughing up clear phlegm. She denies any fever, no nausea vomiting or diarrhea. Patient also complains of pain and redness in the lower extremities. In the ED patient was found to have mild elevation of troponin 0.27, Dr. Fayrene Fearing to ED physician called Dr. Patty Sermons at [facility]. Who recommended to keep the patient at [facility] hospital. Cardiologist not concerned with non-STEMI. Likely from demand ischemia from CHF exacerbation. Patient denies any chest pain, has been complaining of fluttering of muscle in the right chest wall for past few days.   Hospital Course:   Acute on Chronic Hypoxemic Respiratory Failure -2/2 COPD, CHF as well as OSA/OHS (see below for details). -Was able to titrate to [place] on 3/11 and continues to tolerate well. -Appreciate pulmonary input and recommendations. -Uses oxygen chronically at home.  COPD with Acute  Exacerbation -Seems clinically improved. -Clear lungs on exam today. -DC on a prednisone taper and antibiotics for 5 days. -IS and flutter valve ordered by pulmonary.  OSA -CPAP qHs. -Has been non-compliant with CPAP in the hospital.  Acute on Chronic Diastolic CHF -Lasix changed to torsemide 60 mg BID by cardiologist. -Is 17 L negative since admission (still appears volume overloaded on exam). -ECHO: EF 65-70%, LVH, component of right heart failure c/w her pulmonary issues.  Demand Ischemia -Troponins stable around 0.20. -Likely related to ongoing pulmonary issues. -no cardiac work up planned for at present. -ECHO as above. -Appreciate cardiology assistance.  ?bilateral LE Cellulitis -not convinced this is cellulitis. -looks more like venous insufficiency to me. -Vancomycin discontinued 3/11. -No need for further antibiotics upon DC.  Morbid Obesity  DM -Still with difficulty controlling CBGs, likely related to ongoing steroid use. -Should start to come down with steroid titration. -Follow up closely as an OP with PCP for further insulin titration.  Procedures:  None   Consultations:  Cardiology  Discharge Instructions  Discharge Instructions    Diet - low sodium heart healthy    Complete by:  As directed      Diet Carb Modified    Complete by:  As directed      Increase activity slowly    Complete by:  As directed             Medication List    STOP taking these medications        furosemide 80 MG tablet  Commonly known as:  LASIX     Magnesium 500 MG Tabs     metolazone 2.5 MG  tablet  Commonly known as:  ZAROXOLYN     sitaGLIPtin 100 MG tablet  Commonly known as:  JANUVIA      TAKE these medications        albuterol 108 (90 BASE) MCG/ACT inhaler  Commonly known as:  PROVENTIL HFA;VENTOLIN HFA  Inhale 1-2 puffs into the lungs every 6 (six) hours as needed for wheezing or shortness of breath.     ARIPiprazole 10 MG tablet  Commonly known  as:  ABILIFY  Take 10 mg by mouth daily.     atorvastatin 80 MG tablet  Commonly known as:  LIPITOR  Take 80 mg by mouth at bedtime.     azithromycin 250 MG tablet  Commonly known as:  ZITHROMAX  Take as directed     budesonide-formoterol 160-4.5 MCG/ACT inhaler  Commonly known as:  SYMBICORT  Inhale 2 puffs into the lungs 2 (two) times daily.     carisoprodol 350 MG tablet  Commonly known as:  SOMA  Take 350 mg by mouth 4 (four) times daily as needed for muscle spasms.     Cholecalciferol 1000 UNITS tablet  Take 1,000 Units by mouth daily.     clonazePAM 2 MG tablet  Commonly known as:  KLONOPIN  Take 2 mg by mouth at bedtime.     diltiazem 120 MG 24 hr capsule  Commonly known as:  CARDIZEM CD  Take 120 mg by mouth daily.     docusate sodium 100 MG capsule  Commonly known as:  COLACE  Take 100 mg by mouth 2 (two) times daily.     DULoxetine 60 MG capsule  Commonly known as:  CYMBALTA  Take 60 mg by mouth daily.     esomeprazole 40 MG capsule  Commonly known as:  NEXIUM  Take 40 mg by mouth 2 (two) times daily before a meal.     gabapentin 800 MG tablet  Commonly known as:  NEURONTIN  Take 800-1,600 mg by mouth 4 (four) times daily. Patient takes 1tab@6am ,1tab@10am ,1tab@2pm ,1tab@6pm ,2tab@@10pm      Insulin Glargine 300 UNIT/ML Sopn  Inject 120 Units into the skin 2 (two) times daily.     levocetirizine 5 MG tablet  Commonly known as:  XYZAL  Take 5 mg by mouth every evening.     levothyroxine 50 MCG tablet  Commonly known as:  SYNTHROID, LEVOTHROID  Take 50 mcg by mouth daily.     metFORMIN 1000 MG tablet  Commonly known as:  GLUCOPHAGE  Take 1,000 mg by mouth 2 (two) times daily with a meal.     mometasone 50 MCG/ACT nasal spray  Commonly known as:  NASONEX  Place 2 sprays into the nose daily.     montelukast 10 MG tablet  Commonly known as:  SINGULAIR  Take 10 mg by mouth at bedtime.     NOVOLOG FLEXPEN 100 UNIT/ML FlexPen  Generic drug:  insulin  aspart  Inject 35 Units into the skin 3 (three) times daily before meals.     Olopatadine HCl 0.2 % Soln  Place 1 drop into both eyes daily as needed (itching).     OxyCODONE HCl ER 60 MG T12a  Take 60 mg by mouth 3 (three) times daily.     oxyCODONE 15 MG immediate release tablet  Commonly known as:  ROXICODONE  Take 15 mg by mouth 4 (four) times daily.     potassium chloride SA 20 MEQ tablet  Commonly known as:  K-DUR,KLOR-CON  Take 20 mEq by mouth daily.  prednisoLONE acetate 1 % ophthalmic suspension  Commonly known as:  PRED FORTE  Place 1 drop into both eyes 4 (four) times daily.     predniSONE 10 MG tablet  Commonly known as:  DELTASONE  Take 1 tablet (10 mg total) by mouth daily with breakfast. Take 6 tablets today and then decrease by 1 tablet daily until none are left     primidone 50 MG tablet  Commonly known as:  MYSOLINE  Take 50 mg by mouth 2 (two) times daily.     ramipril 10 MG capsule  Commonly known as:  ALTACE  Take 10 mg by mouth daily.     ranitidine 150 MG capsule  Commonly known as:  ZANTAC  Take 150 mg by mouth 2 (two) times daily.     senna-docusate 8.6-50 MG per tablet  Commonly known as:  Senokot-S  Take 2 tablets by mouth every 12 (twelve) hours.     spironolactone 50 MG tablet  Commonly known as:  ALDACTONE  Take 50 mg by mouth daily.     tiotropium 18 MCG inhalation capsule  Commonly known as:  SPIRIVA  Place 18 mcg into inhaler and inhale daily.     torsemide 20 MG tablet  Commonly known as:  DEMADEX  Take 3 tablets (60 mg total) by mouth 2 (two) times daily.     zolpidem 12.5 MG CR tablet  Commonly known as:  AMBIEN CR  Take 12.5 mg by mouth at bedtime.       Allergies  Allergen Reactions  . Avelox [Moxifloxacin Hcl In Nacl] Anaphylaxis  . Wellbutrin [Bupropion] Other (See Comments)    Jittery and anxiety       Follow-up Information    [facility]   Specialty:  Home Health Services    Contact information:   [address] [place] [address] [phone]       Follow up with CRIM, ItalyHAD DAVID, MD. Schedule an appointment as soon as possible for a visit in 1 week.   Specialty:  Family Medicine   Contact information:   [address] [place] [address] [phone]        The results of significant diagnostics from this hospitalization (including imaging, microbiology, ancillary and laboratory) are listed below for reference.    Significant Diagnostic Studies: Dg Chest Port 1 View  11/09/2014   CLINICAL DATA:  Constant moderate shortness of Breath starting 2 days ago. Baseline leg swelling worsening today. Bilateral lower extremity erythema. Diaphoresis and productive cough.  EXAM: PORTABLE CHEST - 1 VIEW  COMPARISON:  06/23/2006  FINDINGS: Shallow inspiration. Cardiac enlargement with mild pulmonary vascular congestion. No edema or consolidation. No blunting of costophrenic angles. No pneumothorax. Calcification of the aorta. Stimulator leads projected over the mid thoracic spine.  IMPRESSION: Cardiac enlargement with mild pulmonary vascular congestion.   Electronically Signed   By: Burman NievesWilliam  Stevens M.D.   On: 11/09/2014 17:40    Microbiology: Recent Results (from the past 240 hour(s))  Culture, blood (routine x 2)     Status: None   Collection Time: 11/09/14  5:13 PM  Result Value Ref Range Status   Specimen Description BLOOD RIGHT ANTECUBITAL  Final   Special Requests BOTTLES DRAWN AEROBIC AND ANAEROBIC 8CC  Final   Culture NO GROWTH 5 DAYS  Final   Report Status 11/14/2014 FINAL  Final  Culture, blood (routine x 2)     Status: None   Collection  Time: 11/09/14  5:20 PM  Result Value Ref Range Status   Specimen Description BLOOD LEFT HAND  Final   Special Requests BOTTLES DRAWN AEROBIC AND ANAEROBIC 6CC  Final   Culture NO GROWTH 5 DAYS  Final   Report Status 11/14/2014 FINAL  Final  Urine culture     Status: None   Collection Time:  11/09/14  5:52 PM  Result Value Ref Range Status   Specimen Description URINE, CATHETERIZED  Final   Special Requests NONE  Final   Colony Count NO GROWTH Performed at [facility]   Final   Culture NO GROWTH Performed at [facility]   Final   Report Status 11/11/2014 FINAL  Final  MRSA PCR Screening     Status: None   Collection Time: 11/09/14 11:30 PM  Result Value Ref Range Status   MRSA by PCR NEGATIVE NEGATIVE Final    Comment:        The GeneXpert MRSA Assay (FDA approved for NASAL specimens only), is one component of a comprehensive MRSA colonization surveillance program. It is not intended to diagnose MRSA infection nor to guide or monitor treatment for MRSA infections.      Labs: Basic Metabolic Panel:  Recent Labs Lab 11/10/14 2200 11/11/14 0513 11/12/14 0545 11/13/14 0708 11/14/14 0601 11/14/14 1324  NA 129* 131* 131* 129* 131*  --   K 5.3* 4.8 5.2* 5.0 4.4  --   CL 90* 89* 87* 84* 82*  --   CO2 33* 35* 38* 37* 42*  --   GLUCOSE 459* 404* 395* 397* 330* 481*  BUN 27* 29* 32* 31* 29*  --   CREATININE 1.25* 0.98 0.96 0.79 0.80  --   CALCIUM 7.9* 8.1* 8.4 8.7 8.8  --    Liver Function Tests:  Recent Labs Lab 11/10/14 0450  AST 17  ALT 13  ALKPHOS 81  BILITOT 0.6  PROT 6.5  ALBUMIN 3.2*   No results for input(s): LIPASE, AMYLASE in the last 168 hours. No results for input(s): AMMONIA in the last 168 hours. CBC:  Recent Labs Lab 11/09/14 1708 11/10/14 0450 11/11/14 0513 11/14/14 0601  WBC 9.8 10.0 10.0 10.3  NEUTROABS 8.1*  --   --   --   HGB 13.0 13.5 13.7 15.3*  HCT 42.1 43.6 43.2 47.8*  MCV 85.4 84.8 85.2 83.3  PLT 237 198 213 251   Cardiac Enzymes:  Recent Labs Lab 11/09/14 1708 11/09/14 2334 11/10/14 0450 11/10/14 1250  TROPONINI 0.27* 0.16* 0.20* 0.10*   BNP: BNP (last 3 results)  Recent Labs  11/09/14 1708  BNP 105.0*    ProBNP (last 3 results) No results for input(s): PROBNP in the last  8760 hours.  CBG:  Recent Labs Lab 11/13/14 1700 11/13/14 2310 11/14/14 0738 11/14/14 1146 11/14/14 1510  GLUCAP 358* 381* 327* 457* 433*       Signed:  HERNANDEZ ACOSTA,ESTELA  [facility] Pager: [phone] 11/14/2014, 4:42 PM

## 2014-11-16 NOTE — Progress Notes (Signed)
UR chart review completed.  

## 2015-06-06 ENCOUNTER — Ambulatory Visit: Payer: Medicare Other | Attending: Pain Medicine | Admitting: Pain Medicine

## 2015-06-06 ENCOUNTER — Encounter: Payer: Self-pay | Admitting: Pain Medicine

## 2015-06-06 VITALS — BP 164/53 | HR 77 | Temp 98.2°F | Resp 16 | Ht 64.0 in | Wt 309.0 lb

## 2015-06-06 DIAGNOSIS — I509 Heart failure, unspecified: Secondary | ICD-10-CM | POA: Insufficient documentation

## 2015-06-06 DIAGNOSIS — E039 Hypothyroidism, unspecified: Secondary | ICD-10-CM | POA: Insufficient documentation

## 2015-06-06 DIAGNOSIS — M797 Fibromyalgia: Secondary | ICD-10-CM | POA: Insufficient documentation

## 2015-06-06 DIAGNOSIS — M533 Sacrococcygeal disorders, not elsewhere classified: Secondary | ICD-10-CM

## 2015-06-06 DIAGNOSIS — M5416 Radiculopathy, lumbar region: Secondary | ICD-10-CM | POA: Diagnosis not present

## 2015-06-06 DIAGNOSIS — M47816 Spondylosis without myelopathy or radiculopathy, lumbar region: Secondary | ICD-10-CM | POA: Insufficient documentation

## 2015-06-06 DIAGNOSIS — M5136 Other intervertebral disc degeneration, lumbar region: Secondary | ICD-10-CM | POA: Diagnosis not present

## 2015-06-06 DIAGNOSIS — E119 Type 2 diabetes mellitus without complications: Secondary | ICD-10-CM | POA: Insufficient documentation

## 2015-06-06 DIAGNOSIS — M79605 Pain in left leg: Secondary | ICD-10-CM | POA: Diagnosis present

## 2015-06-06 DIAGNOSIS — F17200 Nicotine dependence, unspecified, uncomplicated: Secondary | ICD-10-CM | POA: Insufficient documentation

## 2015-06-06 DIAGNOSIS — M545 Low back pain: Secondary | ICD-10-CM | POA: Diagnosis present

## 2015-06-06 DIAGNOSIS — M51369 Other intervertebral disc degeneration, lumbar region without mention of lumbar back pain or lower extremity pain: Secondary | ICD-10-CM | POA: Insufficient documentation

## 2015-06-06 DIAGNOSIS — M1990 Unspecified osteoarthritis, unspecified site: Secondary | ICD-10-CM | POA: Diagnosis not present

## 2015-06-06 DIAGNOSIS — M79604 Pain in right leg: Secondary | ICD-10-CM | POA: Diagnosis present

## 2015-06-06 DIAGNOSIS — Z9689 Presence of other specified functional implants: Secondary | ICD-10-CM | POA: Insufficient documentation

## 2015-06-06 NOTE — Progress Notes (Signed)
Safety precautions to be maintained throughout the outpatient stay will include: orient to surroundings, keep bed in low position, maintain call bell within reach at all times, provide assistance with transfer out of bed and ambulation.  

## 2015-06-06 NOTE — Progress Notes (Signed)
Subjective:    Patient ID: Jennifer Mejia, female    DOB: 09/19/1950, 64 y.o.   MRN: [id number]  HPI  Patient is 64 year old female who comes to pain management Center at the request of Dr. [place] DavidCrim for further evaluation of lumbar and lower extremity pain. The patient states that she has been under care of up Dr. Craig Guess for 14 years. Patient stated that Dr. Craig Guess ) A trial spinal cord stimulator and then had patient go to a note the surgeon for permanent implant of spinal cord stimulator palpation. Patient states she is undergone injections and has been prescribed medications during the 14 years. Patient states that suddenly her Dr. decreased her medications. Patient states that she presently is seeing another doctor who is prescribing medications. She states that she is in hopes of being able to undergo treatment in attempt to decrease severity of symptoms minimize progression of symptoms and avoid the need for more involved treatment. He is without prior surgery of the lumbar region. She stated that her most recent procedure was lumbar epidural steroid injection 05/01/2015. Patient described her pain as aching agonizing downgoing burning sharp shooting stabbing throbbing and tingling" sensation nagging pressure-like sensation that awakens patient from sleep and interferes patient ability to go to sleep patient stated the pain is associated with activity such as bending kneeling intercourse motions sitting standing squatting stooping twisting walking and that the pain decreased with resting cold packs lying down and medications we discussed patient's condition informed patient that we wish to obtain studies of the spine which were unavailable and had not been performed and we will proceed with CT of the lumbar spine since patient has spinal cord stimulator in place. We will have patient return to discuss the results of the studies and consider patient for further treatment as discussed and  explained to patient on today's visit was with understanding and in agreement with suggested treatment plan.      Review of Systems  Cardiovascular Congestive heart failure  Pulmonary Emphysema positive  Bronchitis Positive cigarette smoking Sleep apnea  Neurological Unremarkable  Psychological Anxiety  Depression Insomnia   Gastrointestinal Hiatal hernia Gastroesophageal reflux disease  Genitourinary Unremarkable  Hematological Unremarkable  Endocrine Diabetes mellitus Hyperthyroid  Rheumatological Osteoarthritis Fibromyalgia  Musculoskeletal Unremarkable  Other significant Unremarkable         Objective:   Physical Exam Tenderness to palpation splenius capitis and occipitalis musculature region of moderate degree. Moderate tennis of the cervical facet cervical paraspinal musculature region with unremarkable Spurling maneuver. Slightly decreased grip strength. Tinel and Phalen's maneuver without increased pain of significant degree. Moderate tennis of the acromioclavicular and glenohumeral joint regions. Tenderness to palpation cervical facet cervical paraspinal musculature region and thoracic facet thoracic paraspinal musculature region without crepitus of the thoracic region noted. The lumbar paraspinal musculature region and lumbar facet region a moderate to moderately severe degree. Lateral bending and rotation and extension and palpation lumbar spine reproduce severe pain. There was severe tenderness over the PSIS PII S region as well as the gluteal and piriformis musculature regions. Palpation of the greater trochanteric region iliotibial band region was with mild to moderate tends to palpation. Straight leg raising limited to approximately 20 without increased pain with dorsiflexion noted. No definite sensory deficit of dermatomal distribution detected. Negative clonus negative Homans stasis dermatitis changes of the lower extremities noted with no  increased warmth erythema of the lower extremities noted. Abdomen soft nontender with no costovertebral angle tenderness noted.  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 May consider pending follow-up evaluations  F/U neurological evaluation. May consider pending follow-up evaluations  CT of lumbar spine to be obtained since patient has spinal cord stimulator in place . Will obtain CT of the lumbar spine to evaluate for degenerative disc disease disc protrusion stenosis and other abnormalities which may be contributing to patient's symptoms  May consider radiofrequency rhizolysis or intraspinal procedures pending response to present treatment and F/U evaluation   Patient to call Pain Management Center should patient have concerns prior to scheduled return appointment.

## 2015-06-06 NOTE — Patient Instructions (Addendum)
PLAN   Continue present medication  F/U PCP Dr.Crim  for evaliation of  BP and general medical  condition  F/U surgical evaluation. May consider pending follow-up evaluations  F/U neurological evaluation. May consider pending follow-up evaluations  CT of the lumbar spine, Please ask Morrie Sheldon date of your CT of lumbar spine. Remember to bring a copy of the report of your CT lumbar spine to your next appointment   May consider radiofrequency rhizolysis or intraspinal procedures pending response to present treatment and F/U evaluation   Patient to call [facility] should patient have concerns prior to scheduled return appointment.

## 2015-06-20 ENCOUNTER — Other Ambulatory Visit: Payer: Self-pay | Admitting: Pain Medicine

## 2015-06-21 ENCOUNTER — Encounter: Payer: Self-pay | Admitting: Pain Medicine

## 2015-06-21 ENCOUNTER — Ambulatory Visit: Payer: Medicare Other | Attending: Pain Medicine | Admitting: Pain Medicine

## 2015-06-21 VITALS — BP 113/57 | HR 73 | Temp 97.9°F | Resp 18 | Ht 64.0 in | Wt 309.0 lb

## 2015-06-21 DIAGNOSIS — E039 Hypothyroidism, unspecified: Secondary | ICD-10-CM | POA: Diagnosis not present

## 2015-06-21 DIAGNOSIS — I509 Heart failure, unspecified: Secondary | ICD-10-CM | POA: Diagnosis not present

## 2015-06-21 DIAGNOSIS — M1990 Unspecified osteoarthritis, unspecified site: Secondary | ICD-10-CM | POA: Diagnosis not present

## 2015-06-21 DIAGNOSIS — M47816 Spondylosis without myelopathy or radiculopathy, lumbar region: Secondary | ICD-10-CM

## 2015-06-21 DIAGNOSIS — M79605 Pain in left leg: Secondary | ICD-10-CM | POA: Diagnosis present

## 2015-06-21 DIAGNOSIS — M533 Sacrococcygeal disorders, not elsewhere classified: Secondary | ICD-10-CM | POA: Diagnosis not present

## 2015-06-21 DIAGNOSIS — E1140 Type 2 diabetes mellitus with diabetic neuropathy, unspecified: Secondary | ICD-10-CM | POA: Insufficient documentation

## 2015-06-21 DIAGNOSIS — M5416 Radiculopathy, lumbar region: Secondary | ICD-10-CM | POA: Diagnosis not present

## 2015-06-21 DIAGNOSIS — M545 Low back pain: Secondary | ICD-10-CM | POA: Diagnosis present

## 2015-06-21 DIAGNOSIS — M79604 Pain in right leg: Secondary | ICD-10-CM | POA: Diagnosis present

## 2015-06-21 DIAGNOSIS — M5136 Other intervertebral disc degeneration, lumbar region: Secondary | ICD-10-CM | POA: Insufficient documentation

## 2015-06-21 DIAGNOSIS — M797 Fibromyalgia: Secondary | ICD-10-CM | POA: Diagnosis not present

## 2015-06-21 NOTE — Progress Notes (Signed)
Subjective:    Patient ID: Burman BlacksmithDeborah Mejia, female    DOB: 07/02/1951, 64 y.o.   MRN: [id number]  HPI Patient is 64 year old female returns to [facility] for further evaluation and treatment of pain involving the region of the lower back lower extremity region predominantly with pain occurring in the region of the neck and upper extremity regions as well. On today's visit we reviewed CT of the lumbar spine and discuss patient's treatment regimen. We will proceed with psych evaluation as discussed and will await results of psych evaluation prior to determining if we will proceed with further treatment of patient's condition. We will also informed patient that we prefer to avoid interventional treatment due to patient's general medical condition. Patient also states that she is not candidate for surgery due to her general medical condition. Await results of psych evaluation if any results of psych evaluation we'll consider patient for further treatment in our pain management Center. The patient was understanding and agrees with suggested treatment plan.   Review of Systems     Objective:   Physical Exam  There was tends to palpation of the splenius capitis and occipitalis musculature region of mild to moderate degree. There was moderate tends to palpation over the trapezius levator scapula and rhomboid musculature regions on the left as well as on the right. There appeared to be unremarkable Spurling's maneuver and patient appeared to be with decreased grip strength on the right compared to the left with tremor of the right upper extremity. Indices patient is status post surgery of the right upper extremity and has had weakness and tremor of the right upper extremity since surgery) Tinel and Phalen's maneuver associated with increased pain There was tenderness to palpation over the thoracic facet thoracic paraspinal musculature region with no crepitus of the thoracic region noted.  Palpation over the lumbar paraspinal musculature region lumbar facet region was a tenderness to palpation with moderate to moderately severe tends to palpation noted in the lumbar paraspinal musculature region lumbar facet region. There was moderate to moderately severe tenderness of the PSIS and PII S region as well as the gluteal and piriformis musculature region. Lower extremities were with evidence of stasis dermatitis changes with the lower extremities and with evidence of tenderness to palpation with negative clonus and negative Homans area were multiple scars of the lower extremities including scar of the knee with tenderness to palpation of the knees with evidence of degenerative range of motion of the knees with negative anterior and posterior drawer signs without evidence of new lesions of the skin there was tenderness of the greater trochanteric region and iliotibial band region palpation of the abdominal region was without tenderness to palpation and no costovertebral angle tenderness was noted.      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 We informed patient that we will need results of psych evaluation prior to considering prescribing medication of treatments patient's pain. At the present time patient will follow-up with Dr.Crim who is presently prescribing medications for treatment of patient's pain. Patient. Previously had received medications from Dr.Winikur her pain physician who also  performed procedures  on patient   F/U PCP Dr. Roselyn Bering  for evaliation of  BP and general  medical  condition   F/U surgical evaluation. Patient not felt to be surgical candidate and is without desire to proceed with surgical evaluation at this time  F/U neurological evaluation. May consider pending follow-up evaluations  Psych evaluation. Patient is to undergo psych evaluation prior to considering prescribing medications for treatment of patient's pain and prior to our setting patient for further evaluation and treatment  May consider radiofrequency rhizolysis or intraspinal procedures pending response to present treatment and F/U evaluation   Patient to call [facility] should patient have concerns prior to scheduled return appointment.

## 2015-06-21 NOTE — Patient Instructions (Addendum)
PLAN   Continue present medication  F/U PCP Dr.Crim  for evaliation of  BP and general medical  condition  F/U surgical evaluation. We will avoid considering surgical evaluation. Patient not felt to be surgical candidate  F/U neurological evaluation. We will avoid [facility] EMG studies and other studies at this time\  Please ask staff the date of your psych evaluation and follow-up with psychiatrist as discussed Prior to your receiving medications from our pain clinic, we will need psych evaluation as discussed  May consider radiofrequency rhizolysis or intraspinal procedures pending response to present treatment and F/U evaluation   Patient to call [facility] Management Center should patient have concerns prior to scheduled return appointment.

## 2015-06-21 NOTE — Progress Notes (Signed)
Safety precautions to be maintained throughout the outpatient stay will include: orient to surroundings, keep bed in low position, maintain call bell within reach at all times, provide assistance with transfer out of bed and ambulation.  

## 2015-06-22 ENCOUNTER — Other Ambulatory Visit: Payer: Self-pay | Admitting: Pain Medicine

## 2015-07-24 ENCOUNTER — Encounter: Payer: Self-pay | Admitting: Pain Medicine

## 2015-07-24 ENCOUNTER — Ambulatory Visit: Payer: Medicare Other | Attending: Pain Medicine | Admitting: Pain Medicine

## 2015-07-24 VITALS — BP 167/88 | HR 66 | Temp 98.3°F | Resp 19 | Ht 64.0 in | Wt 307.0 lb

## 2015-07-24 DIAGNOSIS — M47816 Spondylosis without myelopathy or radiculopathy, lumbar region: Secondary | ICD-10-CM

## 2015-07-24 DIAGNOSIS — M533 Sacrococcygeal disorders, not elsewhere classified: Secondary | ICD-10-CM

## 2015-07-24 DIAGNOSIS — M5416 Radiculopathy, lumbar region: Secondary | ICD-10-CM

## 2015-07-24 DIAGNOSIS — M5136 Other intervertebral disc degeneration, lumbar region: Secondary | ICD-10-CM

## 2015-07-24 MED ORDER — CARISOPRODOL 350 MG PO TABS: 350.0000 mg | ORAL_TABLET | Freq: Four times a day (QID) | ORAL | Status: DC | PRN

## 2015-07-24 MED ORDER — OXYCODONE HCL ER 30 MG PO T12A: EXTENDED_RELEASE_TABLET | ORAL | Status: DC

## 2015-07-24 MED ORDER — GABAPENTIN 800 MG PO TABS: ORAL_TABLET | ORAL | Status: DC

## 2015-07-24 MED ORDER — OXYCODONE HCL 10 MG PO TABS: ORAL_TABLET | ORAL | Status: DC

## 2015-07-24 NOTE — Progress Notes (Signed)
Subjective:    Patient ID: Jennifer Mejia, female    DOB: 07/30/1951, 64 y.o.   MRN: [id number]  HPI  The patient is a 64 year old female who returns to pain management for further evaluation and treatment of pain involving the lower back lower extremity regions predominantly. On today's visit we discussed patient's condition and at the present time we will for to avoid interventional treatment. We also informed patient that we wish to review of the letter from Dr. Letta MoynahanLarter prior to prescribing medications for treatment of her pain. We discussed patient's overall condition and medications on today's visit and decision was made to proceed with prescribing medications for treatment of patient's pain. We informed patient that once we review the letter from Dr. Donnald GarreLara that she may receive her medications. The staff call the office of Dr. Letta MoynahanLarter was unable to be reached on today's visit. We will continue to hold patient's prescriptions until we receive letter for review. The patient was understanding and agreed to suggested treatment plan. Patient denied any significant change in her condition. Patient stated that previous medications have been somewhat helpful in terms of reducing her pain. May consider modification of medications pending further assessment of patient's condition and follow-up evaluations. The patient was understanding and agreed with suggested treatment plan    Review of Systems     Objective:   Physical Exam  There was tenderness of the splenius capitis and occipitalis region a mild degree with mild tenderness of the cervical facet cervical paraspinal muscles as well as the trapezius levator scapula and rhomboid musculature regions. There was mild tenderness of the acromioclavicular and glenohumeral joint region and patient appeared to be slightly decreased grip strength with Tinel and Phalen's maneuver reproducing minimal discomfort. Palpation over the thoracic facet thoracic  paraspinal musculature region was attends to palpation of mild to moderate degree with no crepitus of the thoracic region noted. Lateral bending rotation extension and palpation of the lumbar facets reproduced moderately severe discomfort. There was moderate to moderately severe tenderness of the PSIS and PSIS region as well as the gluteal and piriformis musculature regions. Straight leg raising was tolerates approximately 20 without increased pain with dorsiflexion noted. There was negative clonus negative Homans. There was stasis dermatitis changes of the lower extremities distal third of the lower extremities. There was negative clonus negative Homans. Knees were tenderness to palpation with negative anterior and posterior drawer signs without ballottement of the patella. Was tennis of the greater trochanteric region and iliotibial band region. No definite sensory deficit of dermatomal distribution of the lower extremity is noted. 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 We will avoid considering surgical evaluation. Patient not felt to be surgical candidate  F/U neurological evaluation. We will avoid [facility] EMG studies and other studies at this time\  Psych evaluation as discussed  May consider radiofrequency  rhizolysis or  intraspinal procedures pending response to present treatment and F/U evaluation   Patient to call [facility] should patient have concerns prior to scheduled return appointment.

## 2015-07-24 NOTE — Progress Notes (Signed)
Patient picked Up the scripts today at 1551hrs. Per Sarajane JewsKathy Wood.

## 2015-07-24 NOTE — Patient Instructions (Addendum)
PLAN   Continue present medications Neurontin Soma oxycodone and OxyContin These medications may cause respiratory depression excessive sedation confusion and other side effects  F/U PCP Dr.Crim  for evaliation of  BP and general medical  condition  F/U surgical evaluation. We will avoid considering surgical evaluation. Patient not felt to be surgical candidate  F/U neurological evaluation. We will avoid [facility] EMG studies and other studies at this time\  Psych evaluation as discussed  May consider radiofrequency rhizolysis or intraspinal procedures pending response to present treatment and F/U evaluation   Patient to call [facility] Center should patient have concerns prior to scheduled return appointment.

## 2015-08-22 ENCOUNTER — Encounter: Payer: Self-pay | Admitting: Pain Medicine

## 2015-08-22 ENCOUNTER — Ambulatory Visit: Payer: Medicare Other | Attending: Pain Medicine | Admitting: Pain Medicine

## 2015-08-22 VITALS — BP 136/56 | HR 64 | Temp 98.3°F | Resp 18 | Ht 64.0 in | Wt 305.0 lb

## 2015-08-22 DIAGNOSIS — E039 Hypothyroidism, unspecified: Secondary | ICD-10-CM | POA: Diagnosis not present

## 2015-08-22 DIAGNOSIS — E1140 Type 2 diabetes mellitus with diabetic neuropathy, unspecified: Secondary | ICD-10-CM | POA: Diagnosis not present

## 2015-08-22 DIAGNOSIS — M5116 Intervertebral disc disorders with radiculopathy, lumbar region: Secondary | ICD-10-CM | POA: Diagnosis not present

## 2015-08-22 DIAGNOSIS — M5136 Other intervertebral disc degeneration, lumbar region: Secondary | ICD-10-CM

## 2015-08-22 DIAGNOSIS — Z9689 Presence of other specified functional implants: Secondary | ICD-10-CM | POA: Diagnosis not present

## 2015-08-22 DIAGNOSIS — I509 Heart failure, unspecified: Secondary | ICD-10-CM | POA: Diagnosis not present

## 2015-08-22 DIAGNOSIS — M533 Sacrococcygeal disorders, not elsewhere classified: Secondary | ICD-10-CM | POA: Diagnosis not present

## 2015-08-22 DIAGNOSIS — M545 Low back pain: Secondary | ICD-10-CM | POA: Diagnosis present

## 2015-08-22 DIAGNOSIS — M47816 Spondylosis without myelopathy or radiculopathy, lumbar region: Secondary | ICD-10-CM

## 2015-08-22 DIAGNOSIS — M79604 Pain in right leg: Secondary | ICD-10-CM | POA: Diagnosis present

## 2015-08-22 DIAGNOSIS — M5416 Radiculopathy, lumbar region: Secondary | ICD-10-CM

## 2015-08-22 DIAGNOSIS — M79605 Pain in left leg: Secondary | ICD-10-CM | POA: Diagnosis present

## 2015-08-22 MED ORDER — CARISOPRODOL 350 MG PO TABS: ORAL_TABLET | ORAL | Status: DC

## 2015-08-22 MED ORDER — GABAPENTIN 800 MG PO TABS: ORAL_TABLET | ORAL | Status: DC

## 2015-08-22 MED ORDER — OXYCODONE HCL 10 MG PO TABS: ORAL_TABLET | ORAL | Status: DC

## 2015-08-22 MED ORDER — OXYCODONE HCL ER 30 MG PO T12A: EXTENDED_RELEASE_TABLET | ORAL | Status: DC

## 2015-08-22 MED ORDER — OXYCODONE HCL ER 30 MG PO T12A: 30.0000 mg | EXTENDED_RELEASE_TABLET | Freq: Two times a day (BID) | ORAL | Status: DC

## 2015-08-22 NOTE — Progress Notes (Signed)
Subjective:    Patient ID: Burman BlacksmithDeborah Hennick, female    DOB: 04/18/1951, 64 y.o.   MRN: [id number]  HPI  The patient is a 64 year old female who returns to pain management Center for further evaluation and treatment of pain involving the lumbar and lower extremity region. The patient is with pain fairly well-controlled at this time. The patient admits to exacerbation of pain at times and the need for more medication. We discussed interventional treatment which patient has requested an which we preferred to avoid due to patient's general medical condition. We will continue medications consisting of OxyContin oxycodone Neurontin Soma at this time and will consider modifications of treatment regimen pending follow-up evaluation. The patient will undergo follow-up evaluation with her primary care physician Dr.Crim [facility]. The patient is not felt to be surgical candidate for her lumbar and lower extremity pain and we will continue with noninterventional treatment of patient's condition at this time as discussed and explained to patient who was with understanding and agreed to suggested treatment plan.       Review of Systems     Objective:   Physical Exam  There was tenderness to palpation of paraspinal musculature region cervical region cervical facet region a mild degree there was mild tinnitus of the splenius capitis and occipitalis musculature regions. There appeared to be unremarkable Spurling's maneuver and tenderness to palpation of the acromioclavicular and glenohumeral joint regions reproduce mild to moderate discomfort. Tinel and Phalen's maneuver were without increased pain of significant degree. Palpation over the paraspinal musculature region of the thoracic region thoracic region was attends to palpation of moderate degree with lateral bending rotation extension and palpation of the lumbar facets reproducing moderate  discomfort there was evidence of moderate muscle spasms of the thoracic paraspinal musculature region with no crepitus of the thoracic region noted. Palpation over the lumbar paraspinal musculature region lumbar facet region was attends to palpation of moderate degree with lateral bending rotation extension and palpation of the lumbar facets reproduced moderately severe discomfort. There was tends to palpation over the PSIS and PII S region a moderate degree and mild tenderness to moderate tenderness of the greater trochanteric region iliotibial band region. Straight leg raise was tolerates approximately 20 without a definite increase of pain with dorsiflexion noted. Stasis dermatitis changes of the lower extremities were noted with no increased warmth erythema of the lower extremities observed on today's evaluation. DTRs were difficult to elicit there was negative clonus negative Homans abdomen nontender with no costovertebral tenderness noted.      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 with minimal benefit from spinal cord stimulator  Lumbar facet syndrome  Lumbar radiculopathy  Sacroiliac joint dysfunction  History of cellulitis of lower extremities  History of congestive heart failure  Diabetes mellitus and hypothyroidism with concern regarding  Neuropathy    PLAN   Continue present medications Neurontin Soma oxycodone and OxyContin These medications may cause respiratory depression excessive sedation confusion and other side effects OxyContin 30 mg twice a day #30  oxycodone 10 mg limit 2-3 per day #90  Soma 350 mg limit 2-4 per day #100 Neurontin 800 mg limit four per day #120  F/U PCP Dr.Crim  for evaliation of  BP and general medical  condition  F/U surgical evaluation. We will avoid considering surgical evaluation. Patient not felt to be surgical candidate  F/U  cardiologist [facility] as planned  F/U neurological evaluation.  We will avoid [facility] EMG studies and other studies at this time\  Psych evaluation as discussed  May consider radiofrequency rhizolysis or intraspinal procedures pending response to present treatment and F/U evaluation   Patient to call [facility] should patient have concerns prior to scheduled return appointment.              Assessment & Plan:

## 2015-08-22 NOTE — Patient Instructions (Addendum)
PLAN   Continue present medications Neurontin Soma oxycodone and OxyContin These medications may cause respiratory depression excessive sedation confusion and other side effects  F/U PCP Dr.Crim  for evaliation of  BP and general medical  condition  F/U surgical evaluation. We will avoid considering surgical evaluation. Patient not felt to be surgical candidate  F/U  cardiologist [facility] as planned   F/U neurological evaluation. We will avoid [facility] EMG studies and other studies at this time\  Psych evaluation as discussed  May consider radiofrequency rhizolysis or intraspinal procedures pending response to present treatment and F/U evaluation   Patient to call [facility] should patient have concerns prior to scheduled return appointment.

## 2015-08-22 NOTE — Progress Notes (Signed)
Safety precautions to be maintained throughout the outpatient stay will include: orient to surroundings, keep bed in low position, maintain call bell within reach at all times, provide assistance with transfer out of bed and ambulation.  

## 2015-09-06 ENCOUNTER — Telehealth: Payer: Self-pay

## 2015-09-06 NOTE — Telephone Encounter (Signed)
Jennifer Mejia and Nurses As previously stated I prefer to have patient go to a tertiary center for procedures I have previously toll patient that I do not plan to perform procedures on her and recommend that she go to a tertiary center to be considered for interventional treatment. Patient also is to go to the emergency room today if she needs immediate evaluation of her condition and have the emergency room physician called me. Thank you

## 2015-09-06 NOTE — Telephone Encounter (Signed)
Patient states the pain in her right low back and right knee is severe. She would like to have a procedure. Please advise. Patient takes no blood thinners. Thank you- Victorino DikeJennifer

## 2015-09-06 NOTE — Telephone Encounter (Signed)
Nurses and Staff The patient has been informed that I do not plan to perform any procedures on her Please have patient go to the emergency room for evaluation Please schedule neurosurgical evaluation of lower back and lower extremity pain Please refer patient to tertiary center for pain clinic evaluation and treatment

## 2015-09-06 NOTE — Telephone Encounter (Signed)
Patient wants a nurse or Dr. Metta Clinesrisp relating to the pain in her back and legs

## 2015-09-06 NOTE — Telephone Encounter (Signed)
Patient called and informed that Dr. Metta Clinesrisp will not be doing any procedure on her. She was instructed to go to ED for evaluation. Also pateint is requesting to go to tertiary care center for treatment. Jennifer Mejia informed and message routed to her for consult to be started.

## 2015-09-06 NOTE — Telephone Encounter (Signed)
Called patient and informed. Wants to know if she is willing to do this with out sedation will you consider doing a procedure?

## 2015-09-19 ENCOUNTER — Ambulatory Visit: Payer: Medicare Other | Attending: Pain Medicine | Admitting: Pain Medicine

## 2015-09-19 ENCOUNTER — Encounter: Payer: Self-pay | Admitting: Pain Medicine

## 2015-09-19 VITALS — BP 164/54 | HR 77 | Temp 98.3°F | Resp 16 | Ht 64.0 in | Wt 300.0 lb

## 2015-09-19 DIAGNOSIS — E039 Hypothyroidism, unspecified: Secondary | ICD-10-CM | POA: Diagnosis not present

## 2015-09-19 DIAGNOSIS — M5116 Intervertebral disc disorders with radiculopathy, lumbar region: Secondary | ICD-10-CM | POA: Insufficient documentation

## 2015-09-19 DIAGNOSIS — E1140 Type 2 diabetes mellitus with diabetic neuropathy, unspecified: Secondary | ICD-10-CM | POA: Insufficient documentation

## 2015-09-19 DIAGNOSIS — M546 Pain in thoracic spine: Secondary | ICD-10-CM | POA: Diagnosis present

## 2015-09-19 DIAGNOSIS — I509 Heart failure, unspecified: Secondary | ICD-10-CM | POA: Insufficient documentation

## 2015-09-19 DIAGNOSIS — M47816 Spondylosis without myelopathy or radiculopathy, lumbar region: Secondary | ICD-10-CM

## 2015-09-19 DIAGNOSIS — M533 Sacrococcygeal disorders, not elsewhere classified: Secondary | ICD-10-CM | POA: Insufficient documentation

## 2015-09-19 DIAGNOSIS — M5136 Other intervertebral disc degeneration, lumbar region: Secondary | ICD-10-CM

## 2015-09-19 DIAGNOSIS — M542 Cervicalgia: Secondary | ICD-10-CM | POA: Diagnosis present

## 2015-09-19 DIAGNOSIS — M5416 Radiculopathy, lumbar region: Secondary | ICD-10-CM

## 2015-09-19 MED ORDER — GABAPENTIN 800 MG PO TABS: ORAL_TABLET | ORAL | Status: DC

## 2015-09-19 MED ORDER — OXYCODONE HCL ER 30 MG PO T12A: EXTENDED_RELEASE_TABLET | ORAL | Status: DC

## 2015-09-19 MED ORDER — OXYCODONE HCL 10 MG PO TABS
ORAL_TABLET | ORAL | Status: DC
Start: 2015-09-19 — End: 2015-10-17

## 2015-09-19 MED ORDER — CARISOPRODOL 350 MG PO TABS: ORAL_TABLET | ORAL | Status: DC

## 2015-09-19 NOTE — Patient Instructions (Signed)
PLAN   Continue present medications Neurontin Soma oxycodone and OxyContin These medications may cause respiratory depression excessive sedation confusion and other side effects  F/U PCP Dr.Crim  for evaliation of  BP and general medical  condition. You can also discuss with Dr. Roselyn Bering  referral to another facility for injections as we discussed today  F/U surgical evaluation. We will avoid considering surgical evaluation. Patient not felt to be surgical candidate  F/U  cardiologist [facility] as planned   F/U neurological evaluation. We will avoid [facility] EMG studies and other studies at this time\  Psych evaluation as discussed  May consider radiofrequency rhizolysis or intraspinal procedures pending response to present treatment and F/U evaluation   Patient to call [facility] Center should patient have concerns prior to scheduled return appointment.

## 2015-09-19 NOTE — Progress Notes (Signed)
Subjective:    Patient ID: Jennifer Mejia, female    DOB: [birth year], 65 y.o.   MRN: [id number]  HPI The patient is a 65 year old female who returns to pain management for further evaluation and treatment of pain involving neck entire back upper and lower extremity regions. The patient states that the present time the present medication regimen appears to be controlling pain fairly well. The patient states that she would like to undergo interventional treatment as well. We discussed patient's condition and informed patient once again that we preferred to avoid interventional treatment and consideration of patient's general medical condition. He also informed patient that patient may discuss her condition with her primary care physician Dr.Crim we will continue for her to another pain clinic to undergo interventional treatment. We informed patient that we will continue to prescribe medications for treatment of her pain if she underwent interventional treatment at another facility. The patient appeared to be understanding and will further address this with her primary care physician as we discussed. The patient denies trauma change in events of daily living because change in symptoms. The patient will continue medications as prescribed and we will remain available to consider patient for additional modifications of treatment regimen pending follow-up evaluation. The patient agreed to suggested treatment plan.    Review of Systems     Objective:   Physical Exam There was tends to palpation of paraspinal musculature in the cervical region cervical facet region a mild degree with mild tenderness of the splenius capitis and occipitalis musculature regions. There was mild tinnitus to palpation of the thoracic facet thoracic paraspinal muscular treat with no crepitus of the thoracic region noted. There was evidence of muscle spasms noted in the paraspinal must reason of the thoracic region there was tends  to palpation of muscle spasms noted in the upper mid and lower thoracic regions. Palpation of the acromial clavicular and glenohumeral joint region reproduced mild to moderate discomfort and patient appeared to be with unremarkable Spurling's maneuver. Tinel and Phalen's maneuver were without increased pain of significant degree and patient appeared to be with. Palpation of the thoracic facet thoracic paraspinal muscle region was with evidence of muscle spasm in the lower thoracic paraspinal musculature region. Palpation over the lumbar paraspinal must reason lumbar facet region was with moderate tenderness to palpation of the lower thoracic paraspinal musculature region. There was tenderness to palpation of the PSIS and PII S region a moderate degree as well there was mild tenderness of the greater trochanteric region iliotibial band region. No definite sensory deficit or dermatomal dystrophy detected. There was evidence of stasis dermatitis changes of the lower extremity with negative clonus negative Homans. No sensory deficit or dermatomal distribution detected. Knees were without increased increased warmth and erythema noted. Abdomen nontender and no costovertebral tenderness noted.     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 You can also discuss with Dr. Roselyn Bering  referral to another facility for injections as  we discussed today  F/U surgical evaluation. We will avoid considering surgical evaluation. Patient not felt to be surgical candidate  F/U  cardiologist [facility] as planned   F/U neurological evaluation. We will avoid [facility] EMG studies and other studies at this time\  Psych evaluation as discussed  May consider radiofrequency rhizolysis or intraspinal procedures pending response to present treatment and F/U evaluation   Patient to call Pain Management Center should patient have concerns prior to scheduled return appointment.

## 2015-10-03 ENCOUNTER — Other Ambulatory Visit: Payer: Self-pay | Admitting: Pain Medicine

## 2015-10-17 ENCOUNTER — Encounter: Payer: Self-pay | Admitting: Pain Medicine

## 2015-10-17 ENCOUNTER — Ambulatory Visit: Payer: Medicare Other | Attending: Pain Medicine | Admitting: Pain Medicine

## 2015-10-17 VITALS — BP 143/62 | HR 82 | Temp 98.5°F | Resp 16 | Ht 64.0 in | Wt 297.0 lb

## 2015-10-17 DIAGNOSIS — E039 Hypothyroidism, unspecified: Secondary | ICD-10-CM | POA: Diagnosis not present

## 2015-10-17 DIAGNOSIS — I509 Heart failure, unspecified: Secondary | ICD-10-CM | POA: Diagnosis not present

## 2015-10-17 DIAGNOSIS — M5416 Radiculopathy, lumbar region: Secondary | ICD-10-CM

## 2015-10-17 DIAGNOSIS — Z9689 Presence of other specified functional implants: Secondary | ICD-10-CM | POA: Diagnosis not present

## 2015-10-17 DIAGNOSIS — M542 Cervicalgia: Secondary | ICD-10-CM | POA: Diagnosis present

## 2015-10-17 DIAGNOSIS — M5116 Intervertebral disc disorders with radiculopathy, lumbar region: Secondary | ICD-10-CM | POA: Diagnosis not present

## 2015-10-17 DIAGNOSIS — R51 Headache: Secondary | ICD-10-CM | POA: Diagnosis not present

## 2015-10-17 DIAGNOSIS — M79606 Pain in leg, unspecified: Secondary | ICD-10-CM | POA: Diagnosis present

## 2015-10-17 DIAGNOSIS — E1140 Type 2 diabetes mellitus with diabetic neuropathy, unspecified: Secondary | ICD-10-CM | POA: Diagnosis not present

## 2015-10-17 DIAGNOSIS — M5136 Other intervertebral disc degeneration, lumbar region: Secondary | ICD-10-CM

## 2015-10-17 DIAGNOSIS — M545 Low back pain: Secondary | ICD-10-CM | POA: Diagnosis present

## 2015-10-17 DIAGNOSIS — M47816 Spondylosis without myelopathy or radiculopathy, lumbar region: Secondary | ICD-10-CM

## 2015-10-17 DIAGNOSIS — M533 Sacrococcygeal disorders, not elsewhere classified: Secondary | ICD-10-CM | POA: Diagnosis not present

## 2015-10-17 MED ORDER — GABAPENTIN 800 MG PO TABS: ORAL_TABLET | ORAL | Status: DC

## 2015-10-17 MED ORDER — OXYCODONE HCL ER 30 MG PO T12A: EXTENDED_RELEASE_TABLET | ORAL | Status: DC

## 2015-10-17 MED ORDER — OXYCODONE HCL 10 MG PO TABS: ORAL_TABLET | ORAL | Status: DC

## 2015-10-17 MED ORDER — CARISOPRODOL 350 MG PO TABS: ORAL_TABLET | ORAL | Status: DC

## 2015-10-17 NOTE — Patient Instructions (Signed)
PLAN   Continue present medications Neurontin Soma oxycodone and OxyContin These medications may cause respiratory depression excessive sedation confusion and other side effects  F/U PCP Dr.Crim  for evaliation of  BP and general medical  condition. You can also discuss with Dr. Roselyn Bering  referral to another facility for injections as we previously discussed  F/U surgical evaluation. We will avoid considering surgical evaluation. Patient not felt to be surgical candidate  F/U  cardiologist [facility] as planned   F/U neurological evaluation. We will avoid PNCV EMG studies and other studies at this time\  Psych evaluation as discussed  May consider radiofrequency rhizolysis or intraspinal procedures pending response to present treatment and F/U evaluation   Patient to call Pain Management Center should patient have concerns prior to scheduled return appointment.

## 2015-10-17 NOTE — Progress Notes (Signed)
Subjective:    Patient ID: Jennifer Mejia, female    DOB: 11-May-1951, 65 y.o.   MRN: [id number]  HPI  The patient is a 65 year old female who returns to pain management for further evaluation and treatment of pain involving the lower back and lower extremity region predominantly with pain occurring in the cervical upper extremity region and mid back region of lesser degree. The patient also admits to headaches which occur in the region of the neck and radiating to the back of the head. The patient denies any recent trauma change in events of daily living the call significant change in symptomatology. The patient states that she will undergo interventional treatment performed at [facility] pain clinic consisting of lumbar epidural steroid injection. As previously discussed patient we informed patient that we would prefer to avoid interventional treatment palpation. We will continue patient's medications as prescribed. Patient tolerating medications well which have been quite effective in terms of reducing the patient's pain. The patient denies any trauma change in events of daily living the call significant change in symptomatology. We'll consider patient for modification of treatment regimen should there be significant change in condition or should patient have concerns regarding condition at which time we will discuss patient's condition and consider modifications of treatment regimen. All were in agreement with suggested treatment plan  Review of Systems     Objective:   Physical Exam  There was tenderness of the paraspinal misreading cervical region cervical facet region palpation which be produced pain of moderate degree with moderate tenderness of the splenius capitis and a separate talus muscles regions. The patient appeared to be with unremarkable Spurling's maneuver. There was moderate tenderness of the acromioclavicular and glenohumeral joint region. Patient appeared to be with bilaterally equal  grip strength and Tinel and Phalen's maneuver were without increased pain of significant degree. Thoracic facet thoracic paraspinal musculature he was with evidence of moderate muscle spasm with no crepitus of the thoracic region noted. Palpation over the lumbar paraspinal must reason lumbar facet region was attends to palpation of moderately severe degree with lateral bending rotation extension and palpation of the lumbar facets reproducing moderately severe discomfort. Palpation over the PSIS and PII S region was associated with moderate increase of pain. Palpation over the greater trochanteric region iliotibial band region was with mild to moderate discomfort. Straight leg raise was tolerates approximately 20 without increased pain with dorsiflexion noted. There was stasis dermatitis changes of the lower extremity noted with negative clonus negative Homans. EHL strength appeared to be decreased. No definite sensory deficit or dermatomal distribution of the lower extremities noted. There was questionable decreased sensation of the lower extremities in a stocking-type distribution. Abdomen was nontender and no costovertebral tenderness was noted.     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 You can also discuss with Dr. Roselyn Bering  referral to another facility for injections as we previously discussed  F/U  surgical evaluation. We  will avoid considering surgical evaluation. Patient not felt to be surgical candidate  F/U  cardiologist [facility] as planned   F/U neurological evaluation. We will avoid PNCV EMG studies and other studies at this time\  Psych evaluation as discussed  May consider radiofrequency rhizolysis or intraspinal procedures pending response to present treatment and F/U evaluation   Patient to call Pain Management Center should patient have concerns prior to scheduled return appointment.

## 2015-10-17 NOTE — Progress Notes (Signed)
Safety precautions to be maintained throughout the outpatient stay will include: orient to surroundings, keep bed in low position, maintain call bell within reach at all times, provide assistance with transfer out of bed and ambulation.  

## 2015-10-19 ENCOUNTER — Encounter: Payer: Medicare Other | Admitting: Pain Medicine

## 2015-10-31 ENCOUNTER — Other Ambulatory Visit: Payer: Self-pay

## 2015-11-14 ENCOUNTER — Encounter: Payer: Self-pay | Admitting: Pain Medicine

## 2015-11-14 ENCOUNTER — Ambulatory Visit: Payer: Medicare Other | Attending: Pain Medicine | Admitting: Pain Medicine

## 2015-11-14 VITALS — BP 118/81 | HR 75 | Temp 98.3°F | Resp 16 | Ht 64.0 in | Wt 297.0 lb

## 2015-11-14 DIAGNOSIS — E119 Type 2 diabetes mellitus without complications: Secondary | ICD-10-CM | POA: Diagnosis not present

## 2015-11-14 DIAGNOSIS — M5116 Intervertebral disc disorders with radiculopathy, lumbar region: Secondary | ICD-10-CM | POA: Insufficient documentation

## 2015-11-14 DIAGNOSIS — Z9689 Presence of other specified functional implants: Secondary | ICD-10-CM | POA: Insufficient documentation

## 2015-11-14 DIAGNOSIS — M47816 Spondylosis without myelopathy or radiculopathy, lumbar region: Secondary | ICD-10-CM

## 2015-11-14 DIAGNOSIS — I509 Heart failure, unspecified: Secondary | ICD-10-CM | POA: Diagnosis not present

## 2015-11-14 DIAGNOSIS — M5416 Radiculopathy, lumbar region: Secondary | ICD-10-CM

## 2015-11-14 DIAGNOSIS — M79606 Pain in leg, unspecified: Secondary | ICD-10-CM | POA: Diagnosis present

## 2015-11-14 DIAGNOSIS — M533 Sacrococcygeal disorders, not elsewhere classified: Secondary | ICD-10-CM | POA: Diagnosis not present

## 2015-11-14 DIAGNOSIS — M5136 Other intervertebral disc degeneration, lumbar region: Secondary | ICD-10-CM

## 2015-11-14 DIAGNOSIS — E039 Hypothyroidism, unspecified: Secondary | ICD-10-CM | POA: Diagnosis not present

## 2015-11-14 DIAGNOSIS — E0844 Diabetes mellitus due to underlying condition with diabetic amyotrophy: Secondary | ICD-10-CM

## 2015-11-14 DIAGNOSIS — M545 Low back pain: Secondary | ICD-10-CM | POA: Diagnosis present

## 2015-11-14 DIAGNOSIS — M51369 Other intervertebral disc degeneration, lumbar region without mention of lumbar back pain or lower extremity pain: Secondary | ICD-10-CM

## 2015-11-14 DIAGNOSIS — E1140 Type 2 diabetes mellitus with diabetic neuropathy, unspecified: Secondary | ICD-10-CM | POA: Insufficient documentation

## 2015-11-14 MED ORDER — OXYCODONE HCL ER 30 MG PO T12A: EXTENDED_RELEASE_TABLET | ORAL | Status: DC

## 2015-11-14 MED ORDER — CARISOPRODOL 350 MG PO TABS: ORAL_TABLET | ORAL | Status: DC

## 2015-11-14 MED ORDER — OXYCODONE HCL 10 MG PO TABS: ORAL_TABLET | ORAL | Status: DC

## 2015-11-14 MED ORDER — GABAPENTIN 800 MG PO TABS: ORAL_TABLET | ORAL | Status: DC

## 2015-11-14 NOTE — Progress Notes (Signed)
Subjective:    Patient ID: Jennifer Mejia, female    DOB: 10/16/1950, 65 y.o.   MRN: [id number]  HPI  The patient is a 65 year old gentleman who comes to pain management for further evaluation and treatment of pain involving the lower back and lower extremity region. The patient was accompanied by her daughter on today's visit. Discussed patient's condition with patient's permission and daughter was quite understanding of our decision to avoid interventional treatment of patient. The patient also underwent evaluation at [facility] and [facility] has decided to perform performed additional studies prior to proceeding with interventional treatment. We will continue patient's medications as prescribed this time. The patient denies any trauma change in events of daily living the call significant change in symptomatology. The patient appears to be tolerating Neurontin Soma oxycodone and OxyContin without any undesirable side effects. We will continue medications as prescribed at this time and will consider modifications of treatment pending further assessment of patient's condition. The patient was with understanding and in agreement with suggested treatment plan.      Review of Systems     Objective:   Physical Exam   There was tenderness to palpation of the splenius capitis and occipitalis muscles regions of mild to moderate degree. There was mild to moderate tenderness of the cervical facet cervical paraspinal musculature region on the left as well as on the right. Palpation of the acromioclavicular and glenohumeral joint regions reproduce mild discomfort. Patient appeared to be with unremarkable Spurling's maneuver and appeared to be with only slightly decreased grip strength. Tinel and Phalen's maneuver were without increase of pain of significant degree. Facet region thoracic facet region was attends to palpation of moderate degree in the lower thoracic region with no crepitus of the thoracic region  noted. Palpation over the lumbar paraspinal must reason lumbar facet region was with significant increase of pain especially with extension and palpation over the lumbar facets. Lateral bending rotation and extension reproduce severe pain. There was tenderness over the PSIS and PII S region a moderate to moderately severe degree as well as the greater trochanteric region and iliotibial band region. Patient had difficulty attending to stand on tiptoes and heels. Proximal 20 without a definite increased pain with dorsiflexion noted. Stasis dermatitis changes of the lower extremities were noted with negative clonus negative Homans. There was slight erythema of the lower extremities noted with no increased warmth noted. EHL strength was slightly decreased. 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 You can also discuss with Dr. Roselyn Bering  referral to another facility for injections as we previously discussed Procedure to be performed at [facility] pending follow-up evaluation of patient. Patient is to undergo CT scan of lumbar region prior to being considered for interventional treatment at [facility]  F/U surgical evaluation. We will avoid considering surgical evaluation. Patient not felt  to be surgical  candidate  F/U  cardiologist [facility] as planned   F/U neurological evaluation. We will avoid PNCV EMG studies and other studies at this time\  Psych evaluation as discussed  May consider radiofrequency rhizolysis or intraspinal procedures pending response to present treatment and F/U evaluation   Patient to call Pain Management Center should patient have concerns prior to scheduled return appointment

## 2015-11-14 NOTE — Patient Instructions (Addendum)
PLAN   Continue present medications Neurontin Soma oxycodone and OxyContin These medications may cause respiratory depression excessive sedation confusion and other side effects  F/U PCP Dr.Crim  for evaliation of  BP and general medical  condition. You can also discuss with Dr. Roselyn Beringrim  referral to another facility for injections as we previously discussed [facility] follow-up evaluation  F/U surgical evaluation. We will avoid considering surgical evaluation. Patient not felt to be surgical candidate  F/U  cardiologist [facility] as planned   F/U neurological evaluation. We will avoid PNCV EMG studies and other studies at this time\  Psych evaluation as discussed  May consider radiofrequency rhizolysis or intraspinal procedures pending response to present treatment and F/U evaluation   Patient to call Pain Management Center should patient have concerns prior to scheduled return appointment.Pain Management Discharge Instructions  General Discharge Instructions :  If you need to reach your doctor call: Monday-Friday 8:00 am - 4:00 pm at [phone] or toll free [phone].  After clinic hours [phone] to have operator reach doctor.  Bring all of your medication bottles to all your appointments in the pain clinic.  To cancel or reschedule your appointment with Pain Management please remember to call 24 hours in advance to avoid a fee.  Refer to the educational materials which you have been given on: General Risks, I had my Procedure. Discharge Instructions, Post Sedation.  Post Procedure Instructions:  The drugs you were given will stay in your system until tomorrow, so for the next 24 hours you should not drive, make any legal decisions or drink any alcoholic beverages.  You may eat anything you prefer, but it is better to start with liquids then soups and crackers, and gradually work up to solid foods.  Please notify your  doctor immediately if you have any unusual bleeding, trouble breathing or pain that is not related to your normal pain.  Depending on the type of procedure that was done, some parts of your body may feel week and/or numb.  This usually clears up by tonight or the next day.  Walk with the use of an assistive device or accompanied by an adult for the 24 hours.  You may use ice on the affected area for the first 24 hours.  Put ice in a Ziploc bag and cover with a towel and place against area 15 minutes on 15 minutes off.  You may switch to heat after 24 hours.

## 2015-11-14 NOTE — Progress Notes (Signed)
Safety precautions to be maintained throughout the outpatient stay will include: orient to surroundings, keep bed in low position, maintain call bell within reach at all times, provide assistance with transfer out of bed and ambulation.  

## 2015-12-12 ENCOUNTER — Encounter: Payer: Self-pay | Admitting: Pain Medicine

## 2015-12-12 ENCOUNTER — Ambulatory Visit: Payer: Medicare Other | Attending: Pain Medicine | Admitting: Pain Medicine

## 2015-12-12 VITALS — BP 163/58 | HR 79 | Temp 98.3°F | Resp 20 | Ht 64.0 in | Wt 293.0 lb

## 2015-12-12 DIAGNOSIS — E119 Type 2 diabetes mellitus without complications: Secondary | ICD-10-CM | POA: Insufficient documentation

## 2015-12-12 DIAGNOSIS — M5136 Other intervertebral disc degeneration, lumbar region: Secondary | ICD-10-CM

## 2015-12-12 DIAGNOSIS — M47816 Spondylosis without myelopathy or radiculopathy, lumbar region: Secondary | ICD-10-CM

## 2015-12-12 DIAGNOSIS — I509 Heart failure, unspecified: Secondary | ICD-10-CM | POA: Insufficient documentation

## 2015-12-12 DIAGNOSIS — M5416 Radiculopathy, lumbar region: Secondary | ICD-10-CM

## 2015-12-12 DIAGNOSIS — M533 Sacrococcygeal disorders, not elsewhere classified: Secondary | ICD-10-CM | POA: Diagnosis not present

## 2015-12-12 DIAGNOSIS — E0844 Diabetes mellitus due to underlying condition with diabetic amyotrophy: Secondary | ICD-10-CM

## 2015-12-12 DIAGNOSIS — Z9689 Presence of other specified functional implants: Secondary | ICD-10-CM | POA: Diagnosis not present

## 2015-12-12 DIAGNOSIS — R52 Pain, unspecified: Secondary | ICD-10-CM | POA: Diagnosis present

## 2015-12-12 DIAGNOSIS — M5116 Intervertebral disc disorders with radiculopathy, lumbar region: Secondary | ICD-10-CM | POA: Insufficient documentation

## 2015-12-12 DIAGNOSIS — E039 Hypothyroidism, unspecified: Secondary | ICD-10-CM | POA: Insufficient documentation

## 2015-12-12 MED ORDER — GABAPENTIN 800 MG PO TABS: ORAL_TABLET | ORAL | Status: DC

## 2015-12-12 MED ORDER — OXYCODONE HCL 10 MG PO TABS: ORAL_TABLET | ORAL | Status: DC

## 2015-12-12 MED ORDER — CARISOPRODOL 350 MG PO TABS: ORAL_TABLET | ORAL | Status: DC

## 2015-12-12 MED ORDER — OXYCODONE HCL ER 30 MG PO T12A: EXTENDED_RELEASE_TABLET | ORAL | Status: DC

## 2015-12-12 NOTE — Patient Instructions (Signed)
PLAN   Continue present medications Neurontin Soma oxycodone and OxyContin These medications may cause respiratory depression excessive sedation confusion and other side effects  F/U PCP Dr.Crim  for evaliation of  BP and general medical  condition. You can also discuss with Dr. Roselyn Beringrim  referral to another facility for injections as we previously discussed [facility] pending follow-up evaluation and results of imaging studies  F/U surgical evaluation. We will avoid considering surgical evaluation. Patient not felt to be surgical candidate  F/U  cardiologist [facility] as planned   F/U neurological evaluation. We will avoid PNCV EMG studies and other studies at this time\  Psych evaluation as discussed  May consider radiofrequency rhizolysis or intraspinal procedures pending response to present treatment and F/U evaluation   Patient to call Pain Management Center should patient have concerns prior to scheduled return appointment.

## 2015-12-12 NOTE — Progress Notes (Signed)
Safety precautions to be maintained throughout the outpatient stay will include: orient to surroundings, keep bed in low position, maintain call bell within reach at all times, provide assistance with transfer out of bed and ambulation.  

## 2015-12-12 NOTE — Progress Notes (Signed)
Subjective:    Patient ID: Jennifer Mejia, female    DOB: 1951/01/14, 65 y.o.   MRN: [id number]  HPI  The patient is a 65 year old female who returns to pain management. The patient is to undergo further evaluation and treatment at [facility] pain clinic as discussed. The patient we will review imaging studies of the spine with her physician at [facility] and will be considered for interventional treatment. We have preferred to avoid interventional treatment due to patient's general medical condition as discussed and as explained to patient on prior occasions. The patient states that she has been unable to follow-up with her physicians at [facility] due to the unexpected accident which her father had. The patient's father was involved in a motor vehicle accident and patient has been spending considerable time with her father. We discussed patient's condition and will continue medications as prescribed. The patient denies any other significant changes in events of daily living to cause him significant change in her symptomatology. All agreed to suggested treatment plan. The patient continues to be with lower back lower extremity pain predominantly aggravated by standing walking and increasing as the day progresses. We will consider modification of treatment pending follow-up evaluation in pending response to treatment at [facility] pain clinic as well. All agreed to suggested treatment plan      Review of Systems     Objective:   Physical Exam  There was tenderness to palpation of the splenius capitis and occipitalis regions of mild degree with mild to moderate tenderness of the cervical facet cervical paraspinal musculature region. Palpation over the region of the thoracic facet thoracic paraspinal musculature region was attends to palpation of moderate to moderately severe degree in the lower thoracic region with no crepitus of the thoracic region noted. There was evidence of moderate to moderately severe muscle  spasms involving the lower thoracic paraspinal muscular region. The patient was with tenderness to palpation of the acromioclavicular and glenohumeral joint regions of moderate degree with unremarkable Spurling's maneuver. The patient appeared to be with slightly decreased grip strength with Tinel and Phalen's maneuver reproducing mild to moderate discomfort. There was tends to palpation over the region of the lumbar facet lumbar paraspinal musculature region a moderate degree with lateral bending rotation extension and palpation over the lumbar facet region reproducing moderate discomfort. Straight leg raising was tolerates approximately 20 without a definite increased pain with dorsiflexion noted. There was tenderness over the region of the PSIS and PII S region a moderate degree with mild tenderness of the greater trochanteric region and iliotibial band region. There was stasis dermatitis changes of the lower extremity regions noted with out significant edema of the lower extremities noted on today's visit. There was negative clonus negative Homans. EHL strength appeared to be decreased. 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   Procedure to be performed at [facility] pending follow-up evaluation and results of  imaging studies  F/U surgical evaluation. We will avoid considering surgical evaluation. Patient not felt to be surgical candidate  F/U  cardiologist [facility] as planned   F/U neurological evaluation. We will avoid PNCV EMG studies and other studies at this time\  Psych evaluation as discussed  May consider radiofrequency rhizolysis or intraspinal procedures pending response to present treatment and F/U evaluation   Patient to call Pain Management Center should patient have concerns prior to scheduled return appointment.

## 2015-12-16 LAB — TOXASSURE SELECT 13 (MW), URINE: PDF: 0

## 2016-01-09 ENCOUNTER — Encounter: Payer: Self-pay | Admitting: Pain Medicine

## 2016-01-09 ENCOUNTER — Ambulatory Visit: Payer: Medicare Other | Attending: Pain Medicine | Admitting: Pain Medicine

## 2016-01-09 VITALS — BP 141/69 | HR 73 | Temp 98.5°F | Resp 18 | Ht 64.0 in | Wt 293.0 lb

## 2016-01-09 DIAGNOSIS — M545 Low back pain: Secondary | ICD-10-CM | POA: Diagnosis present

## 2016-01-09 DIAGNOSIS — M5116 Intervertebral disc disorders with radiculopathy, lumbar region: Secondary | ICD-10-CM | POA: Insufficient documentation

## 2016-01-09 DIAGNOSIS — I509 Heart failure, unspecified: Secondary | ICD-10-CM | POA: Insufficient documentation

## 2016-01-09 DIAGNOSIS — E039 Hypothyroidism, unspecified: Secondary | ICD-10-CM | POA: Insufficient documentation

## 2016-01-09 DIAGNOSIS — Z9689 Presence of other specified functional implants: Secondary | ICD-10-CM | POA: Diagnosis not present

## 2016-01-09 DIAGNOSIS — M533 Sacrococcygeal disorders, not elsewhere classified: Secondary | ICD-10-CM

## 2016-01-09 DIAGNOSIS — E1140 Type 2 diabetes mellitus with diabetic neuropathy, unspecified: Secondary | ICD-10-CM | POA: Insufficient documentation

## 2016-01-09 DIAGNOSIS — M5416 Radiculopathy, lumbar region: Secondary | ICD-10-CM

## 2016-01-09 DIAGNOSIS — I872 Venous insufficiency (chronic) (peripheral): Secondary | ICD-10-CM | POA: Insufficient documentation

## 2016-01-09 DIAGNOSIS — M79606 Pain in leg, unspecified: Secondary | ICD-10-CM | POA: Diagnosis present

## 2016-01-09 DIAGNOSIS — E0844 Diabetes mellitus due to underlying condition with diabetic amyotrophy: Secondary | ICD-10-CM

## 2016-01-09 DIAGNOSIS — M47816 Spondylosis without myelopathy or radiculopathy, lumbar region: Secondary | ICD-10-CM

## 2016-01-09 DIAGNOSIS — M5136 Other intervertebral disc degeneration, lumbar region: Secondary | ICD-10-CM

## 2016-01-09 MED ORDER — OXYCODONE HCL ER 30 MG PO T12A: EXTENDED_RELEASE_TABLET | ORAL | Status: DC

## 2016-01-09 MED ORDER — OXYCODONE HCL 10 MG PO TABS: ORAL_TABLET | ORAL | Status: DC

## 2016-01-09 MED ORDER — GABAPENTIN 800 MG PO TABS: ORAL_TABLET | ORAL | Status: DC

## 2016-01-09 MED ORDER — CARISOPRODOL 350 MG PO TABS: ORAL_TABLET | ORAL | Status: DC

## 2016-01-09 NOTE — Progress Notes (Signed)
Safety precautions to be maintained throughout the outpatient stay will include: orient to surroundings, keep bed in low position, maintain call bell within reach at all times, provide assistance with transfer out of bed and ambulation.  

## 2016-01-09 NOTE — Progress Notes (Signed)
   Subjective:    Patient ID: Jennifer BlacksmithDeborah Mejia, female    DOB: 07/07/1951, 65 y.o.   MRN: [id number]  HPI  The patient is a 65 year old female who returns to pain management for further evaluation and treatment of pain involving the lower back and lower extremity dominant. We discussed patient's condition previously and informed patient that we preferred to avoid interventional treatment palpation. The patient is [facility] and [facility] as we discussed. We will remain available to consider patient for modification of treatment regimen including medications and other aspects of treatment regimen pending response to treatment and follow-up evaluation. We have agreed the patient may [facility] if she wishes to do such and we will remain available to adjust patient's treatment regimen accordingly. The patient denied any trauma change in events of daily living the call significant change in symptomatology. We will continue medications consisting of Neurontin Soma oxycodone and OxyContin. All agreed to suggested treatment plan.    Review of Systems     Objective:   Physical Exam  There was tenderness to palpation of the paraspinal musculature region of the cervical region cervical facet region palpation which reproduces pain of moderate degree with patient being with tenderness to palpation of the acromioclavicular and glenohumeral joint regions of moderate degree as well. Palpation over the region of the thoracic facet thoracic paraspinal musculature region was with moderate discomfort with patient being with unremarkable Spurling's maneuver. There was tenderness to palpation of the thoracic facet thoracic paraspinal musculature region a moderate degree with no crepitus of the thoracic region noted. Patient appeared to be with bilaterally equal grip strength  and Tinel and Phalen's maneuver were without increase of pain of significant degree. Palpation over the lumbar paraspinal musculatures and lumbar facet region was associated with moderate increase of pain with moderate tends to palpation of the PSIS and PII S region as well as the gluteal and piriformis muscles region. The knees were tenderness to palpation. The patient was a stasis dermatitis changes of the lower extremities. EHL strength appeared to be decreased. Straight leg raise was tolerates approximately 20 without increased pain with dorsiflexion noted. There was negative clonus negative Homans. Abdomen nontender with no costovertebral tenderness noted.      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  [facility]   F/U surgical evaluation. We will avoid considering surgical evaluation. Patient not felt to be surgical candidate  F/U  cardiologist [facility] as planned   F/U neurological evaluation. We will avoid PNCV EMG studies and other studies at this time\  Psych evaluation as discussed  May consider radiofrequency rhizolysis or intraspinal procedures pending response to present treatment and F/U evaluation   Patient to call Pain Management Center should patient have concerns prior to scheduled return appointment.

## 2016-01-09 NOTE — Patient Instructions (Signed)
PLAN   Continue present medications Neurontin Soma oxycodone and OxyContin These medications may cause respiratory depression excessive sedation confusion and other side effects  F/U PCP Dr.Crim  for evaliation of  BP and general medical  condition.  [facility]   F/U surgical evaluation. We will avoid considering surgical evaluation. Patient not felt to be surgical candidate  F/U  cardiologist [facility] as planned   F/U neurological evaluation. We will avoid PNCV EMG studies and other studies at this time\  Psych evaluation as discussed  May consider radiofrequency rhizolysis or intraspinal procedures pending response to present treatment and F/U evaluation   Patient to call Pain Management Center should patient have concerns prior to scheduled return appointment.

## 2016-02-13 ENCOUNTER — Encounter: Payer: Self-pay | Admitting: Pain Medicine

## 2016-02-13 ENCOUNTER — Ambulatory Visit: Payer: Medicare Other | Attending: Pain Medicine | Admitting: Pain Medicine

## 2016-02-13 VITALS — BP 137/78 | HR 79 | Temp 98.5°F | Resp 18 | Ht 64.0 in | Wt 293.0 lb

## 2016-02-13 DIAGNOSIS — M5116 Intervertebral disc disorders with radiculopathy, lumbar region: Secondary | ICD-10-CM | POA: Insufficient documentation

## 2016-02-13 DIAGNOSIS — I509 Heart failure, unspecified: Secondary | ICD-10-CM | POA: Insufficient documentation

## 2016-02-13 DIAGNOSIS — M533 Sacrococcygeal disorders, not elsewhere classified: Secondary | ICD-10-CM | POA: Diagnosis not present

## 2016-02-13 DIAGNOSIS — E1140 Type 2 diabetes mellitus with diabetic neuropathy, unspecified: Secondary | ICD-10-CM | POA: Diagnosis not present

## 2016-02-13 DIAGNOSIS — M47816 Spondylosis without myelopathy or radiculopathy, lumbar region: Secondary | ICD-10-CM

## 2016-02-13 DIAGNOSIS — M545 Low back pain: Secondary | ICD-10-CM | POA: Diagnosis present

## 2016-02-13 DIAGNOSIS — E0844 Diabetes mellitus due to underlying condition with diabetic amyotrophy: Secondary | ICD-10-CM

## 2016-02-13 DIAGNOSIS — E039 Hypothyroidism, unspecified: Secondary | ICD-10-CM | POA: Insufficient documentation

## 2016-02-13 DIAGNOSIS — M5136 Other intervertebral disc degeneration, lumbar region: Secondary | ICD-10-CM

## 2016-02-13 DIAGNOSIS — M5416 Radiculopathy, lumbar region: Secondary | ICD-10-CM

## 2016-02-13 DIAGNOSIS — M79606 Pain in leg, unspecified: Secondary | ICD-10-CM | POA: Diagnosis present

## 2016-02-13 MED ORDER — OXYCODONE HCL ER 30 MG PO T12A: EXTENDED_RELEASE_TABLET | ORAL | Status: DC

## 2016-02-13 MED ORDER — GABAPENTIN 800 MG PO TABS: ORAL_TABLET | ORAL | Status: DC

## 2016-02-13 MED ORDER — OXYCODONE HCL 10 MG PO TABS: ORAL_TABLET | ORAL | Status: DC

## 2016-02-13 MED ORDER — CARISOPRODOL 350 MG PO TABS: ORAL_TABLET | ORAL | Status: DC

## 2016-02-13 NOTE — Progress Notes (Signed)
Subjective:    Patient ID: Jennifer Mejia, female    DOB: [birth year], 65 y.o.   MRN: [id number]  HPI   The patient is a 65 year old female who returns to pain management for further evaluation and treatment of pain. The patient has significant pain involving the lumbar lower extremity region and recently has been treated for cellulitis of the lower extremities. The patient has undergone prior evaluation at [facility] pain clinic and was being considered for interventional treatment at [facility] pain clinic. The patient is without interventional treatment performed at [facility] pain clinic as of this date. We discussed patient's condition and will continue present medications including Neurontin Soma oxycodone and OxyContin. The patient has requested that we perform interventional treatment and we did have decided to avoid interventional treatment and patient due to patient's general medical condition. We have informed patient that she may undergo interventional treatment at [facility] pain clinic if she wishes to do such. At the present time we will continue medications as prescribed and we will remain available to consider additional modifications of treatment regimen pending response to treatment and follow-up evaluations. The patient denies any other significant events to cause any change in symptoms and denied any trauma. All agreed to suggested treatment plan   Review of Systems     Objective:   Physical Exam    There was tenderness to palpation of these splenius capitis and occipitalis regions of mild degree with mild tenderness of the cervical facet cervical paraspinal musculature region. Palpation over the region of the thoracic facet thoracic paraspinal musculature region was associated with moderate discomfort in the lower thoracic paraspinal musculature region. Palpation of the acromioclavicular and glenohumeral joint regions reproduce moderate discomfort and patient was with out significant increase of  pain with Tinel and Phalen's maneuver. Palpation over the thoracic region was without crepitus. Palpation over the lumbar paraspinal musculatures and lumbar facet region was with moderate to moderately severe discomfort. There was increased pain of moderate to moderately severe degree with lateral bending rotation extension and palpation over the lumbar facet region. Palpation over the PSIS and PII S region reproduced moderate discomfort. Palpation of the greater trochanteric region iliotibial band region reproduced mild to moderate discomfort. There was stasis dermatitis changes of the lower extremities. No significant increased warmth or erythema of the lower extremities were noted to be present. There was negative clonus negative Homans. EHL strength appeared to be slightly decreased and there appeared to be decreased sensation of the lower extremities in a stocking-type distribution.  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  Follow-up evaluation [facility] as discussed  F/U surgical evaluation. We will avoid considering surgical evaluation. Patient not felt to be surgical candidate  [facility]  F/U neurological evaluation. We will avoid PNCV  EMG studies  and other studies at this time\  Psych evaluation as discussed  May consider radiofrequency rhizolysis or intraspinal procedures pending response to present treatment and F/U evaluation. We will avoid considering such treatment   Patient to call [facility] Management Center should patient have concerns prior to scheduled return appointment.

## 2016-02-13 NOTE — Patient Instructions (Addendum)
PLAN   Continue present medications Neurontin Soma oxycodone and OxyContin These medications may cause respiratory depression excessive sedation confusion and other side effects  F/U PCP Dr Allena KatzPatel  for evaliation of  BP lower extremity cellulitis and general medical  condition.  [facility] as [facility]  F/U surgical evaluation. We will avoid considering surgical evaluation. Patient not felt to be surgical candidate  [facility]  F/U neurological evaluation. We will avoid PNCV EMG studies and other studies at this time\  Psych evaluation as discussed  May consider radiofrequency rhizolysis or intraspinal procedures pending response to present treatment and F/U evaluation. We will avoid considering such treatment   Patient to call Pain Management Center should patient have concerns prior to scheduled return appointment.

## 2016-02-13 NOTE — Progress Notes (Signed)
Safety precautions to be maintained throughout the outpatient stay will include: orient to surroundings, keep bed in low position, maintain call bell within reach at all times, provide assistance with transfer out of bed and ambulation.  

## 2016-03-11 ENCOUNTER — Ambulatory Visit: Payer: Medicare Other | Attending: Pain Medicine | Admitting: Pain Medicine

## 2016-03-11 ENCOUNTER — Encounter: Payer: Self-pay | Admitting: Pain Medicine

## 2016-03-11 VITALS — BP 160/63 | HR 76 | Temp 98.7°F | Resp 16 | Ht 64.0 in | Wt 293.0 lb

## 2016-03-11 DIAGNOSIS — M545 Low back pain: Secondary | ICD-10-CM | POA: Diagnosis present

## 2016-03-11 DIAGNOSIS — M5116 Intervertebral disc disorders with radiculopathy, lumbar region: Secondary | ICD-10-CM | POA: Diagnosis not present

## 2016-03-11 DIAGNOSIS — E119 Type 2 diabetes mellitus without complications: Secondary | ICD-10-CM | POA: Diagnosis not present

## 2016-03-11 DIAGNOSIS — Z9689 Presence of other specified functional implants: Secondary | ICD-10-CM | POA: Insufficient documentation

## 2016-03-11 DIAGNOSIS — E0844 Diabetes mellitus due to underlying condition with diabetic amyotrophy: Secondary | ICD-10-CM

## 2016-03-11 DIAGNOSIS — M47816 Spondylosis without myelopathy or radiculopathy, lumbar region: Secondary | ICD-10-CM

## 2016-03-11 DIAGNOSIS — M5136 Other intervertebral disc degeneration, lumbar region: Secondary | ICD-10-CM

## 2016-03-11 DIAGNOSIS — M79606 Pain in leg, unspecified: Secondary | ICD-10-CM | POA: Diagnosis present

## 2016-03-11 DIAGNOSIS — I872 Venous insufficiency (chronic) (peripheral): Secondary | ICD-10-CM | POA: Diagnosis not present

## 2016-03-11 DIAGNOSIS — E039 Hypothyroidism, unspecified: Secondary | ICD-10-CM | POA: Diagnosis not present

## 2016-03-11 DIAGNOSIS — M5416 Radiculopathy, lumbar region: Secondary | ICD-10-CM

## 2016-03-11 DIAGNOSIS — I509 Heart failure, unspecified: Secondary | ICD-10-CM | POA: Diagnosis not present

## 2016-03-11 DIAGNOSIS — M533 Sacrococcygeal disorders, not elsewhere classified: Secondary | ICD-10-CM | POA: Diagnosis not present

## 2016-03-11 MED ORDER — CARISOPRODOL 350 MG PO TABS: ORAL_TABLET | ORAL | Status: DC

## 2016-03-11 MED ORDER — OXYCODONE HCL ER 30 MG PO T12A: EXTENDED_RELEASE_TABLET | ORAL | Status: DC

## 2016-03-11 MED ORDER — GABAPENTIN 800 MG PO TABS: ORAL_TABLET | ORAL | Status: DC

## 2016-03-11 MED ORDER — OXYCODONE HCL 10 MG PO TABS: ORAL_TABLET | ORAL | Status: DC

## 2016-03-11 NOTE — Progress Notes (Signed)
Subjective:    Patient ID: Jennifer BlacksmithDeborah Mejia, female    DOB: 02/12/1951, 65 y.o.   MRN: [id number]  HPI  The patient is a 65 year old female who returns to pain management for further evaluation and treatment of pain involving the region of the lower back and lower extremity region predominantly patient is with pain of the neck and upper extremity region of lesser degree. The patient has [facility] and has been [facility]. At the present time patient states that she is without plans to proceed with interventional treatment at [facility]. We discussed patient's condition and patient continues medications consisting of Neurontin Soma oxycodone and OxyContin. We have informed patient that we preferred to avoid interventional treatment due to patient's general medical condition. We will continue medications as prescribed and patient [facility] discussed and as well. Patient denied any recent trauma change in events of daily living the call significant change in symptomatology. All agreed to suggested treatment plan  Review of Systems     Objective:   Physical Exam  There was tenderness of the paraspinal musculature region the cervical and cervical facet region a mild to moderate degree with mild to moderate tenderness over the splenius capitis and occipitalis regions. Patient appeared to be with unremarkable Spurling's maneuver. There was moderate tenderness to palpation of the acromioclavicular and glenohumeral joint regions. Tinel and Phalen's maneuver were without increase of pain of significant degree. Palpation of the thoracic region was attends to palpation without crepitus of the thoracic region noted. There was moderate to moderately severe tenderness over the lumbar facet lumbar paraspinal musculature region with lateral bending rotation extension and palpation over  the lumbar facets reproduced severe discomfort. The patient was unable to stand erect without experiencing significant pain of the lumbar region in the region of the lumbar facets. Palpation over the PSIS and PII S region reproduced moderate discomfort. Straight leg raising was limited to approximately 20 without increase of pain with dorsiflexion noted. There was stasis dermatitis changes of the lower extremities without increased warmth of the lower extremities noted. There was negative clonus negative Homans. EHL strength appeared to be slightly decreased. 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 Medications can cause respiratory depression and cause you to stop breathing, cause excessive sedation, cause confusion and other side effects.  Exercise extreme caution when taking medication and call EMS or go to the Emergency Department immediately if you develop any of these symptoms   F/U PCP  for evaliation of  BP  and general medical  condition.  [facility] as discussed  F/U surgical evaluation. We will avoid considering surgical evaluation. Patient not felt to be surgical candidate  F[facility]  F/U neurological evaluation. We will avoid PNCV EMG studies and other studies at this time\  Psych evaluation as  discussed  May consider radiofrequency rhizolysis or intraspinal procedures pending response to present treatment and F/U evaluation. We will avoid considering such treatment   Patient to call Pain Management Center should patient have concerns prior to scheduled return appointment.

## 2016-03-11 NOTE — Progress Notes (Signed)
Safety precautions to be maintained throughout the outpatient stay will include: orient to surroundings, keep bed in low position, maintain call bell within reach at all times, provide assistance with transfer out of bed and ambulation.  

## 2016-03-11 NOTE — Patient Instructions (Signed)
PLAN   Continue present medications Neurontin Soma oxycodone and OxyContin These medications may cause respiratory depression excessive sedation confusion and other side effects. Medications can cause respiratory depression and cause you to stop breathing, cause excessive sedation, cause confusion and other side effects.  Exercise extreme caution when taking medication and call EMS or go to the Emergency Department immediately if you develop any of these symptoms   F/U PCP Dr Allena KatzPatel  for evaliation of  BP lower extremity cellulitis and general medical  condition.  [facility] as discussed  F/U surgical evaluation. We will avoid considering surgical evaluation. Patient not felt to be surgical candidate  [facility]  F/U neurological evaluation. We will avoid PNCV EMG studies and other studies at this time\  Psych evaluation as discussed  May consider radiofrequency rhizolysis or intraspinal procedures pending response to present treatment and F/U evaluation. We will avoid considering such treatment   Patient to call Pain Management Center should patient have concerns prior to scheduled return appointment.

## 2016-04-10 ENCOUNTER — Encounter: Payer: Self-pay | Admitting: Pain Medicine

## 2016-04-10 ENCOUNTER — Ambulatory Visit: Payer: Medicare Other | Attending: Pain Medicine | Admitting: Pain Medicine

## 2016-04-10 VITALS — BP 114/48 | HR 79 | Temp 98.6°F | Resp 18 | Ht 64.0 in | Wt 293.0 lb

## 2016-04-10 DIAGNOSIS — E119 Type 2 diabetes mellitus without complications: Secondary | ICD-10-CM | POA: Insufficient documentation

## 2016-04-10 DIAGNOSIS — E039 Hypothyroidism, unspecified: Secondary | ICD-10-CM | POA: Diagnosis not present

## 2016-04-10 DIAGNOSIS — M5136 Other intervertebral disc degeneration, lumbar region: Secondary | ICD-10-CM

## 2016-04-10 DIAGNOSIS — M47816 Spondylosis without myelopathy or radiculopathy, lumbar region: Secondary | ICD-10-CM

## 2016-04-10 DIAGNOSIS — M79606 Pain in leg, unspecified: Secondary | ICD-10-CM | POA: Diagnosis present

## 2016-04-10 DIAGNOSIS — M545 Low back pain: Secondary | ICD-10-CM | POA: Diagnosis present

## 2016-04-10 DIAGNOSIS — M533 Sacrococcygeal disorders, not elsewhere classified: Secondary | ICD-10-CM | POA: Insufficient documentation

## 2016-04-10 DIAGNOSIS — R51 Headache: Secondary | ICD-10-CM | POA: Diagnosis not present

## 2016-04-10 DIAGNOSIS — M5116 Intervertebral disc disorders with radiculopathy, lumbar region: Secondary | ICD-10-CM | POA: Diagnosis not present

## 2016-04-10 DIAGNOSIS — I509 Heart failure, unspecified: Secondary | ICD-10-CM | POA: Insufficient documentation

## 2016-04-10 DIAGNOSIS — Z9689 Presence of other specified functional implants: Secondary | ICD-10-CM | POA: Insufficient documentation

## 2016-04-10 DIAGNOSIS — E0841 Diabetes mellitus due to underlying condition with diabetic mononeuropathy: Secondary | ICD-10-CM

## 2016-04-10 DIAGNOSIS — M5416 Radiculopathy, lumbar region: Secondary | ICD-10-CM

## 2016-04-10 MED ORDER — GABAPENTIN 800 MG PO TABS: ORAL_TABLET | ORAL | 0 refills | Status: DC

## 2016-04-10 MED ORDER — CARISOPRODOL 350 MG PO TABS: ORAL_TABLET | ORAL | 0 refills | Status: DC

## 2016-04-10 MED ORDER — OXYCODONE HCL ER 30 MG PO T12A: EXTENDED_RELEASE_TABLET | ORAL | 0 refills | Status: DC

## 2016-04-10 MED ORDER — OXYCODONE HCL 10 MG PO TABS: ORAL_TABLET | ORAL | 0 refills | Status: DC

## 2016-04-10 NOTE — Progress Notes (Signed)
    The patient is a 65 year old female who returns to pain management for further evaluation and treatment of pain involving the lumbar and lower extremity 39region predominantly .Marland Kitchen. The patient is [facility] and there has been discussion regarding interventional treatment. The patient will follow-up with [facility] clinic as discussed. We will continue medications as prescribed at this time which patient is tolerating well without undesirable side effects. The patient also has headaches and has been felt to be with component of greater occipital neuralgia headaches. We will continue present medications and patient will [facility] for interventional treatment as discussed.. The patient will continue Neurontin Soma oxycodone and OxyContin at this time. All agreed to suggested treatment plan.       Physical examination   There was tenderness of the splenius capitis and occipitalis region palpation which be produced moderate discomfort with moderate tenderness of the cervical and thoracic facet regions. No new masses of the head and neck were noted. There was limited range of motion of the cervical spine with unremarkable Spurling's maneuver. Palpation of the acromioclavicular and glenohumeral joint regions reproduce moderate discomfort and patient had moderate difficulty attempted to perform drop test. The patient appeared to be with slightly decreased grip strength and Tinel and Phalen's maneuver reproduce moderate discomfort. Palpation of the thoracic region was attends to palpation without crepitus of the thoracic region noted. Palpation over the lumbar paraspinal muscular treat and lumbar facet region was associated with moderate to moderately severe discomfort with extension and palpation of the lumbar facets reproducing moderate to moderately severe discomfort. There was moderate tenderness of the PSIS and PII S region as well as the  greater trochanteric region and iliotibial band regions. The knees were attends to palpation with the lower extremities with evidence of stasis dermatitis changes with negative clonus negative Homans. Straight leg raise was tolerates approximately 20 without increased pain with dorsiflexion noted. No definite sensory deficit or dermatomal distribution was detected. 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 Medications can cause respiratory depression and cause you to stop breathing, cause excessive sedation, cause confusion and other side effects.  Exercise extreme caution when taking medication and call EMS or go to the Emergency Department immediately if you develop any of these symptoms   F/U PCP Dr Allena KatzPatel  for evaliation of  BP and general medical  condition.  [facility] as discussed  F/U surgical evaluation. We will avoid considering surgical evaluation. Patient not felt to be surgical candidate  [facility]  F/U neurological evaluation. We will avoid PNCV EMG studies and other studies at this time\  Psych evaluation as discussed  May consider radiofrequency rhizolysis or intraspinal procedures pending response to present treatment and F/U evaluation. We will avoid considering such treatment   Patient to call Pain Management Center should  patient have concerns prior to scheduled return appointment.

## 2016-04-10 NOTE — Patient Instructions (Signed)
PLAN   Continue present medications Neurontin Soma oxycodone and OxyContin These medications may cause respiratory depression excessive sedation confusion and other side effects. Medications can cause respiratory depression and cause you to stop breathing, cause excessive sedation, cause confusion and other side effects.  Exercise extreme caution when taking medication and call EMS or go to the Emergency Department immediately if you develop any of these symptoms   F/U PCP Dr Allena KatzPatel  for evaliation of  BP and general medical  condition.  [facility] as discussed  F/U surgical evaluation. We will avoid considering surgical evaluation. Patient not felt to be surgical candidate  [facility]  F/U neurological evaluation. We will avoid PNCV EMG studies and other studies at this time\  Psych evaluation as discussed  May consider radiofrequency rhizolysis or intraspinal procedures pending response to present treatment and F/U evaluation. We will avoid considering such treatment   Patient to call Pain Management Center should patient have concerns prior to scheduled return appointment.

## 2016-04-30 ENCOUNTER — Telehealth: Payer: Self-pay | Admitting: *Deleted

## 2016-05-07 ENCOUNTER — Ambulatory Visit: Payer: Medicare Other | Admitting: Pain Medicine

## 2016-05-08 ENCOUNTER — Ambulatory Visit: Payer: Medicare Other | Attending: Pain Medicine | Admitting: Pain Medicine

## 2016-05-08 ENCOUNTER — Encounter: Payer: Self-pay | Admitting: Pain Medicine

## 2016-05-08 VITALS — BP 177/74 | HR 75 | Temp 98.8°F | Resp 16 | Ht 64.0 in | Wt 293.0 lb

## 2016-05-08 DIAGNOSIS — E0841 Diabetes mellitus due to underlying condition with diabetic mononeuropathy: Secondary | ICD-10-CM

## 2016-05-08 DIAGNOSIS — M5136 Other intervertebral disc degeneration, lumbar region: Secondary | ICD-10-CM

## 2016-05-08 DIAGNOSIS — E119 Type 2 diabetes mellitus without complications: Secondary | ICD-10-CM | POA: Diagnosis not present

## 2016-05-08 DIAGNOSIS — M545 Low back pain: Secondary | ICD-10-CM | POA: Diagnosis present

## 2016-05-08 DIAGNOSIS — M533 Sacrococcygeal disorders, not elsewhere classified: Secondary | ICD-10-CM | POA: Insufficient documentation

## 2016-05-08 DIAGNOSIS — I872 Venous insufficiency (chronic) (peripheral): Secondary | ICD-10-CM | POA: Diagnosis not present

## 2016-05-08 DIAGNOSIS — M5116 Intervertebral disc disorders with radiculopathy, lumbar region: Secondary | ICD-10-CM | POA: Insufficient documentation

## 2016-05-08 DIAGNOSIS — Z9689 Presence of other specified functional implants: Secondary | ICD-10-CM | POA: Insufficient documentation

## 2016-05-08 DIAGNOSIS — M5416 Radiculopathy, lumbar region: Secondary | ICD-10-CM

## 2016-05-08 DIAGNOSIS — M47816 Spondylosis without myelopathy or radiculopathy, lumbar region: Secondary | ICD-10-CM

## 2016-05-08 DIAGNOSIS — E039 Hypothyroidism, unspecified: Secondary | ICD-10-CM | POA: Diagnosis not present

## 2016-05-08 DIAGNOSIS — I509 Heart failure, unspecified: Secondary | ICD-10-CM | POA: Insufficient documentation

## 2016-05-08 MED ORDER — OXYCODONE HCL ER 30 MG PO T12A: EXTENDED_RELEASE_TABLET | ORAL | 0 refills | Status: AC

## 2016-05-08 MED ORDER — CARISOPRODOL 350 MG PO TABS: ORAL_TABLET | ORAL | 0 refills | Status: AC

## 2016-05-08 MED ORDER — OXYCODONE HCL 10 MG PO TABS: ORAL_TABLET | ORAL | 0 refills | Status: AC

## 2016-05-08 MED ORDER — GABAPENTIN 800 MG PO TABS: ORAL_TABLET | ORAL | 0 refills | Status: AC

## 2016-05-08 NOTE — Patient Instructions (Signed)
PLAN   Continue present medications Neurontin Soma oxycodone and OxyContin  CAUTION  These medications may cause respiratory depression excessive sedation confusion and other side effects. Medications can cause respiratory depression and cause you to stop breathing, cause excessive sedation, cause confusion and other side effects.  Exercise extreme caution when taking medication and call EMS or go to the Emergency Department immediately if you develop any of these symptoms   F/U PCP Dr Allena KatzPatel  for evaliation of  BP and general medical  condition.  [facility] as discussed  F/U surgical evaluation. We will avoid considering surgical evaluation. Patient not felt to be surgical candidate  [facility]  F/U neurological evaluation. We will avoid PNCV EMG studies and other studies at this time\  Psych evaluation as discussed  May consider radiofrequency rhizolysis or intraspinal procedures pending response to present treatment and F/U evaluation. We will avoid considering such treatment   Patient to call Pain Management Center should patient have concerns prior to scheduled return appointment.

## 2016-05-08 NOTE — Progress Notes (Signed)
Safety precautions to be maintained throughout the outpatient stay will include: orient to surroundings, keep bed in low position, maintain call bell within reach at all times, provide assistance with transfer out of bed and ambulation.  

## 2016-05-09 NOTE — Progress Notes (Signed)
The patient is a 65 year old female who returns to pain management for further evaluation and treatment of pain involving the lower back lower extremity region. The patient is with pain fairly well-controlled with noninterventional treatment measures consisting of oxycodone OxyContin Soma and Neurontin. Patient also [facility]. There is being concern regarding patient undergoing interventional treatment for treatment of her pain. We have informed patient that we preferred to have patient [facility] due to patient's general medical condition and concern regarding exacerbation of patient's general medical condition with steroid injection. The patient was with understanding and will [facility] as discussed. We will continue present medications as discussed with patient on today's visit and remain available to consider additional modifications of treatment pending response to treatment and follow-up evaluation. All agreed to suggested treatment plan.    [place] physical examination  There was tenderness of the splenius capitis and occipitalis region of mild degree with mild tenderness over the cervical facet cervical paraspinal musculature region. No crepitus of the thoracic region was noted. Palpation over the lumbar region was with increased pain with lateral bending rotation extension and palpation of the lumbar facet region. Outpatient over the PSIS and PII S region reproduces moderate discomfort without crepitus of the thoracic region noted. Tinel and Phalen's maneuver were without increased pain of significant degree and patient appeared to be with slightly decreased grip strength. There was tenderness over the lumbar paraspinal must reason lumbar facet region a moderate degree. There was negative clonus negative Homans without sensory deficit or dermatomal distribution  detected. There was stasis dermatitis changes of the lower extremities noted and there was a decreased EHL strength. 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 Medications can cause respiratory depression and cause you to stop breathing, cause excessive sedation, cause confusion and other side effects.  Exercise extreme caution when taking medication and call EMS or go to the Emergency Department immediately if you develop any of these symptoms   F/U PCP Dr Allena KatzPatel  for evaliation of  BP and general medical  condition.  [facility]  F/U surgical evaluation. We will avoid considering surgical evaluation. Patient not felt to be surgical candidate  F[facility]  [facility]  F/U neurological evaluation. We will avoid PNCV EMG studies and other studies at this time\  Psych evaluation as discussed  May consider radiofrequency rhizolysis or intraspinal procedures pending response to present treatment and F/U evaluation. We will avoid considering such treatment   Patient to call Pain Management Center should patient have concerns prior to scheduled return appointment.

## 2016-08-19 ENCOUNTER — Other Ambulatory Visit: Payer: Self-pay | Admitting: Pain Medicine

## 2016-09-17 ENCOUNTER — Other Ambulatory Visit: Payer: Self-pay | Admitting: Pain Medicine

## 2016-11-05 IMAGING — CR DG CHEST 1V PORT
1 series · 1 of 1 positions shown · non-contrast
Comparison: 06/23/2006

CLINICAL DATA: Constant moderate shortness of Breath starting 2
days ago. Baseline leg swelling worsening today. Bilateral lower
extremity erythema. Diaphoresis and productive cough.

EXAM:
PORTABLE CHEST - 1 VIEW

[portable]
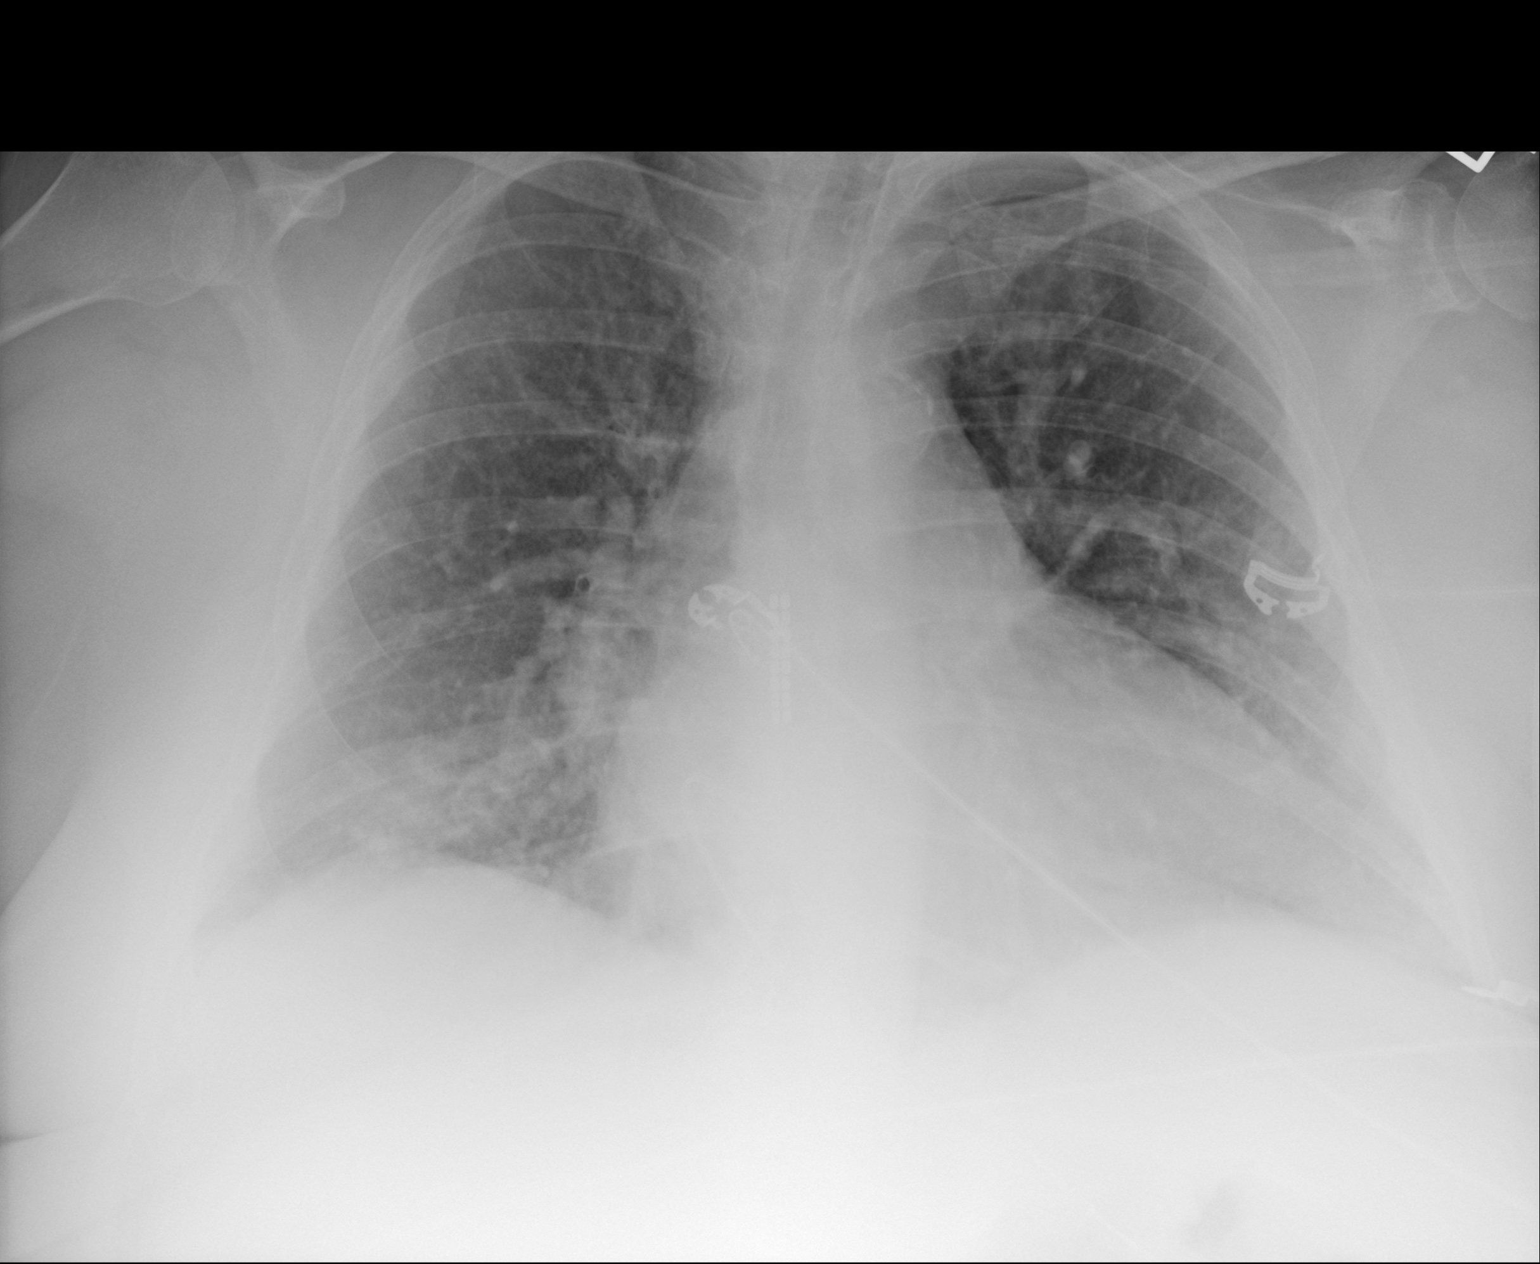

[1 of 1 positions shown; findings below may reference images not displayed]

FINDINGS: Shallow inspiration. Cardiac enlargement with mild pulmonary
vascular congestion. No edema or consolidation. No blunting of
costophrenic angles. No pneumothorax. Calcification of the aorta.
Stimulator leads projected over the mid thoracic spine.
IMPRESSION: Cardiac enlargement with mild pulmonary vascular congestion.

## 2017-05-03 DEATH — deceased
